# Patient Record
Sex: Female | Born: 1940 | Race: White | Hispanic: No | Smoking: Former smoker
Health system: Southern US, Community
[De-identification: ages and names within clinical notes are randomized; demographics above are authoritative.]

## PROBLEM LIST (undated history)

## (undated) DIAGNOSIS — I10 Essential (primary) hypertension: Secondary | ICD-10-CM

## (undated) HISTORY — DX: Essential (primary) hypertension: I10

---

## 2004-05-11 ENCOUNTER — Inpatient Hospital Stay (HOSPITAL_COMMUNITY): Admission: EM | Admit: 2004-05-11 | Discharge: 2004-05-14 | Payer: Self-pay | Admitting: Emergency Medicine

## 2008-05-26 ENCOUNTER — Emergency Department (HOSPITAL_COMMUNITY): Admission: EM | Admit: 2008-05-26 | Discharge: 2008-05-26 | Payer: Self-pay | Admitting: Emergency Medicine

## 2008-06-30 ENCOUNTER — Observation Stay (HOSPITAL_COMMUNITY): Admission: EM | Admit: 2008-06-30 | Discharge: 2008-07-01 | Payer: Self-pay | Admitting: Emergency Medicine

## 2009-08-24 IMAGING — CR DG CHEST 2V
2 series · 2 of 2 positions shown · non-contrast
Comparison: 05/11/2004

CLINICAL DATA: Left arm pain and numbness with indigestion.  Chest
pain.

CHEST - 2 VIEW

[w chest pa]
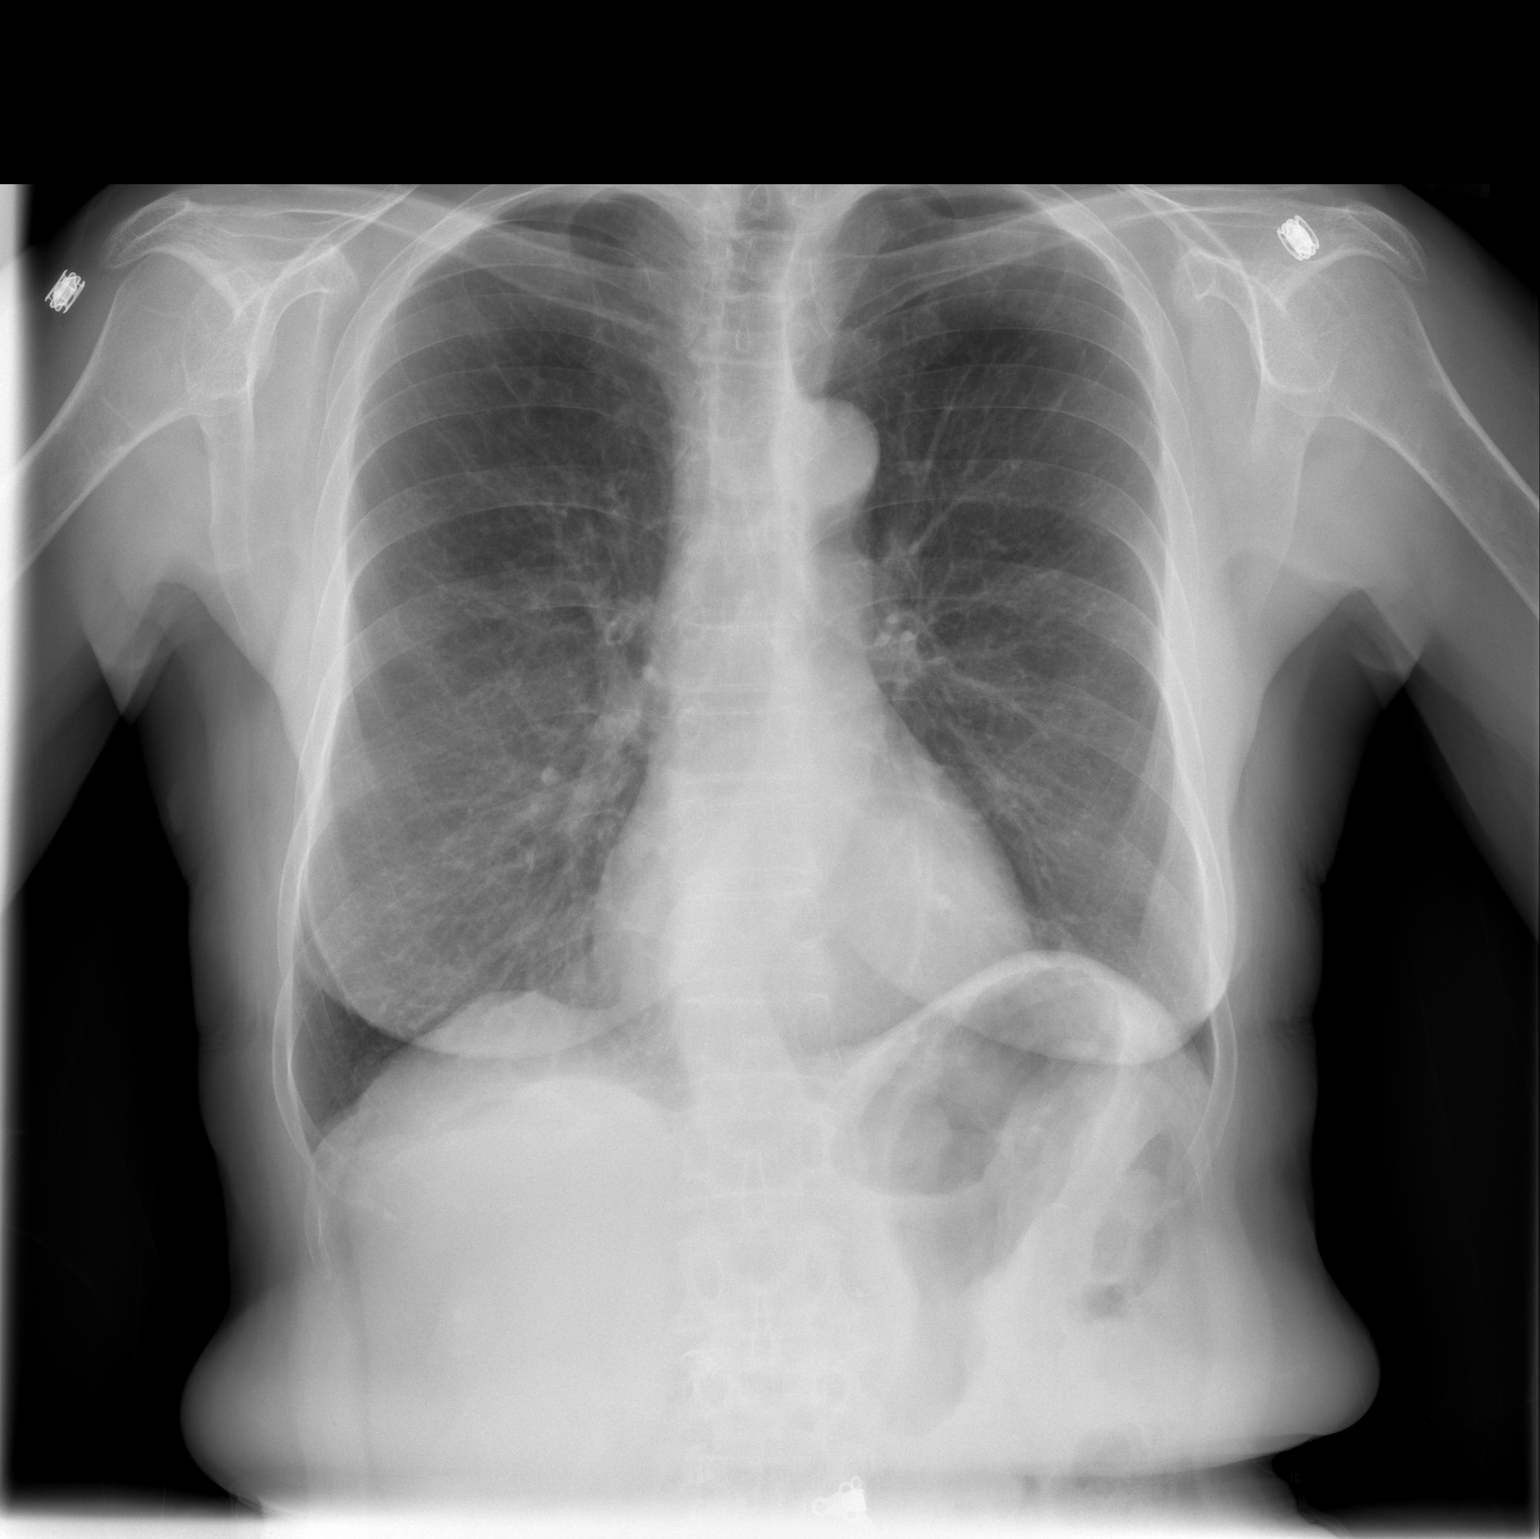

[w chest lat]
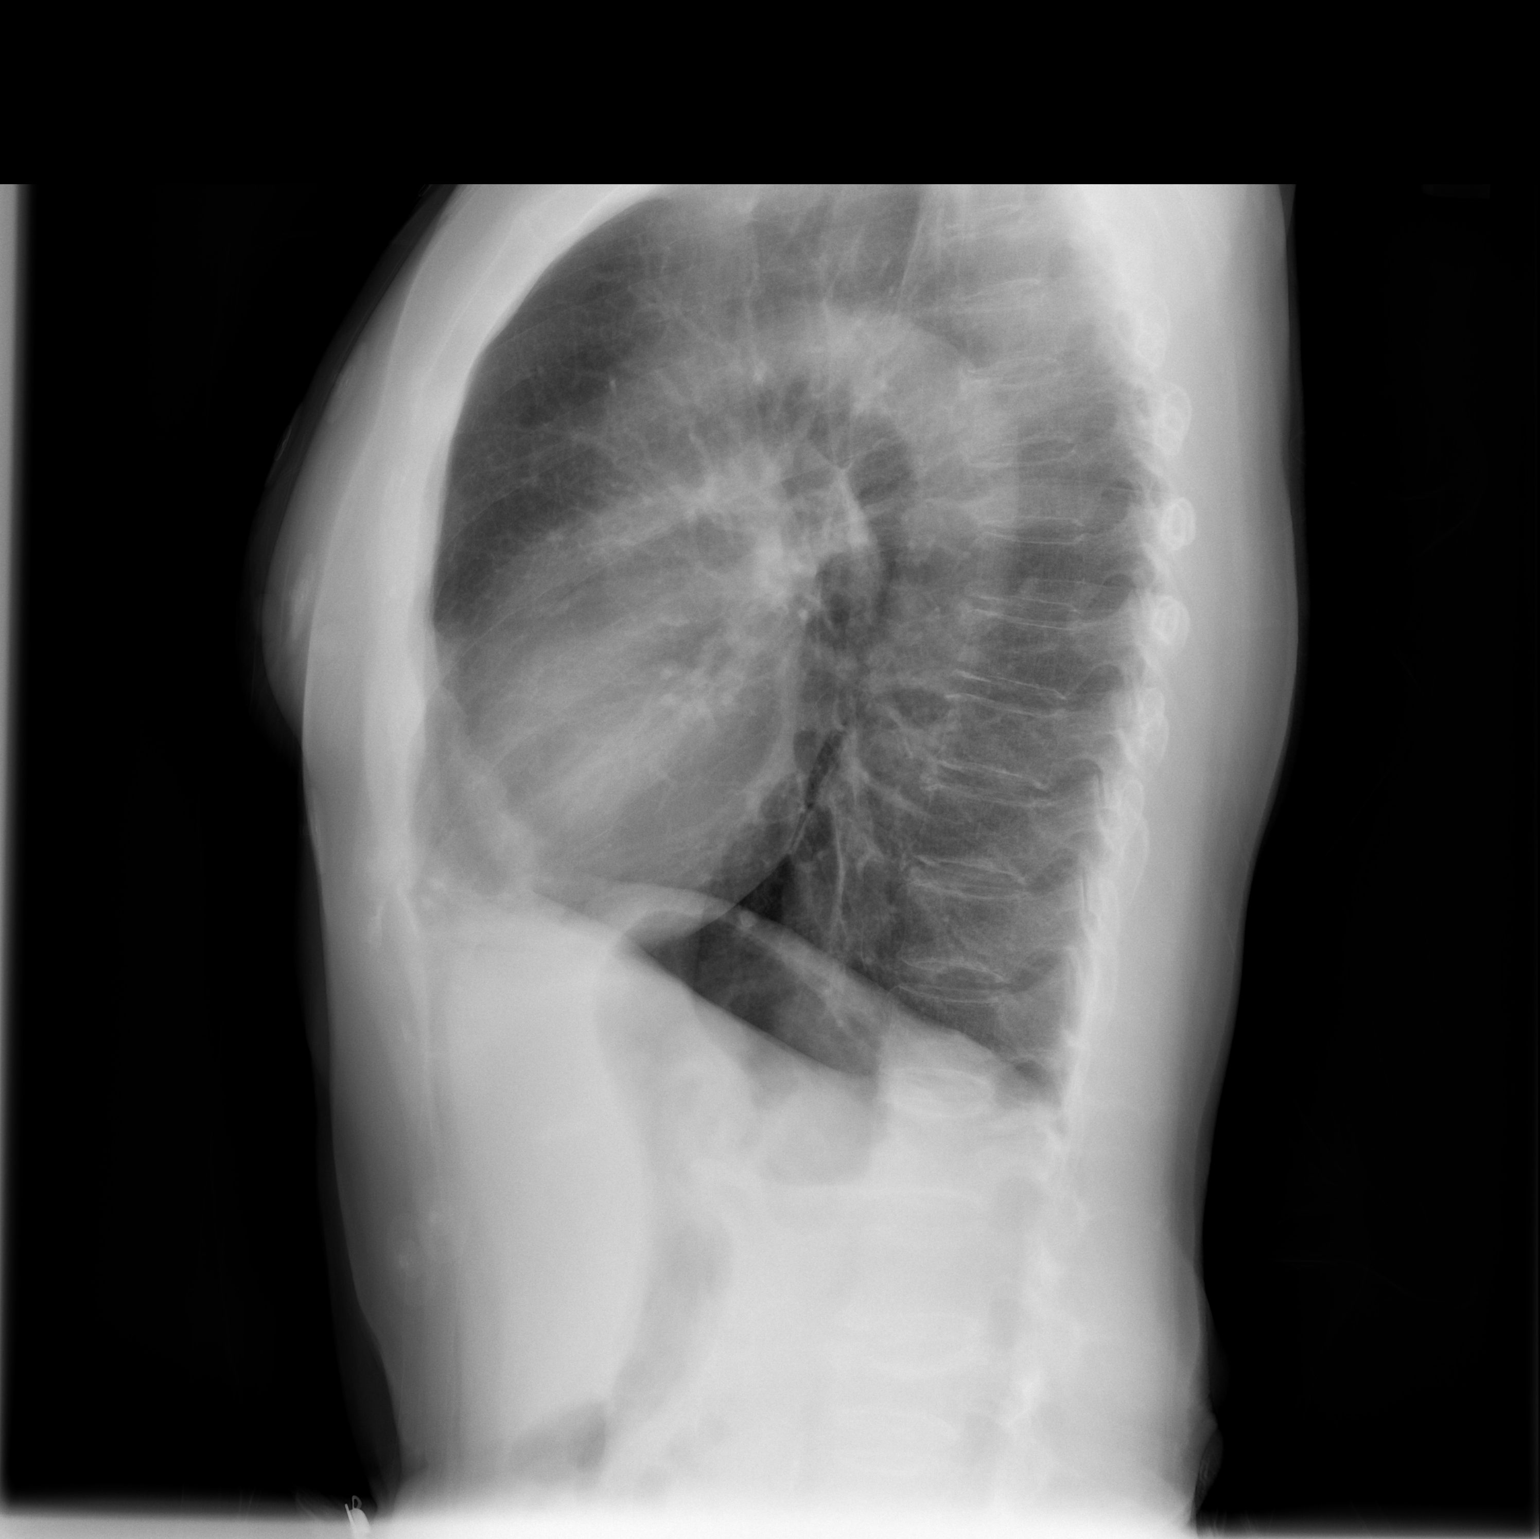

[2 of 2 positions shown; findings below may reference images not displayed]

FINDINGS: Trachea is midline.  Heart size normal.  Thoracic aorta
is calcified and tortuous.  Lungs are clear.  No pleural fluid.
IMPRESSION: No acute findings.

## 2009-09-28 IMAGING — CR DG CHEST 2V
2 series · 2 of 2 positions shown · non-contrast
Comparison: 05/26/2008

CLINICAL DATA: Chest pain

CHEST - 2 VIEW

[w chest pa]
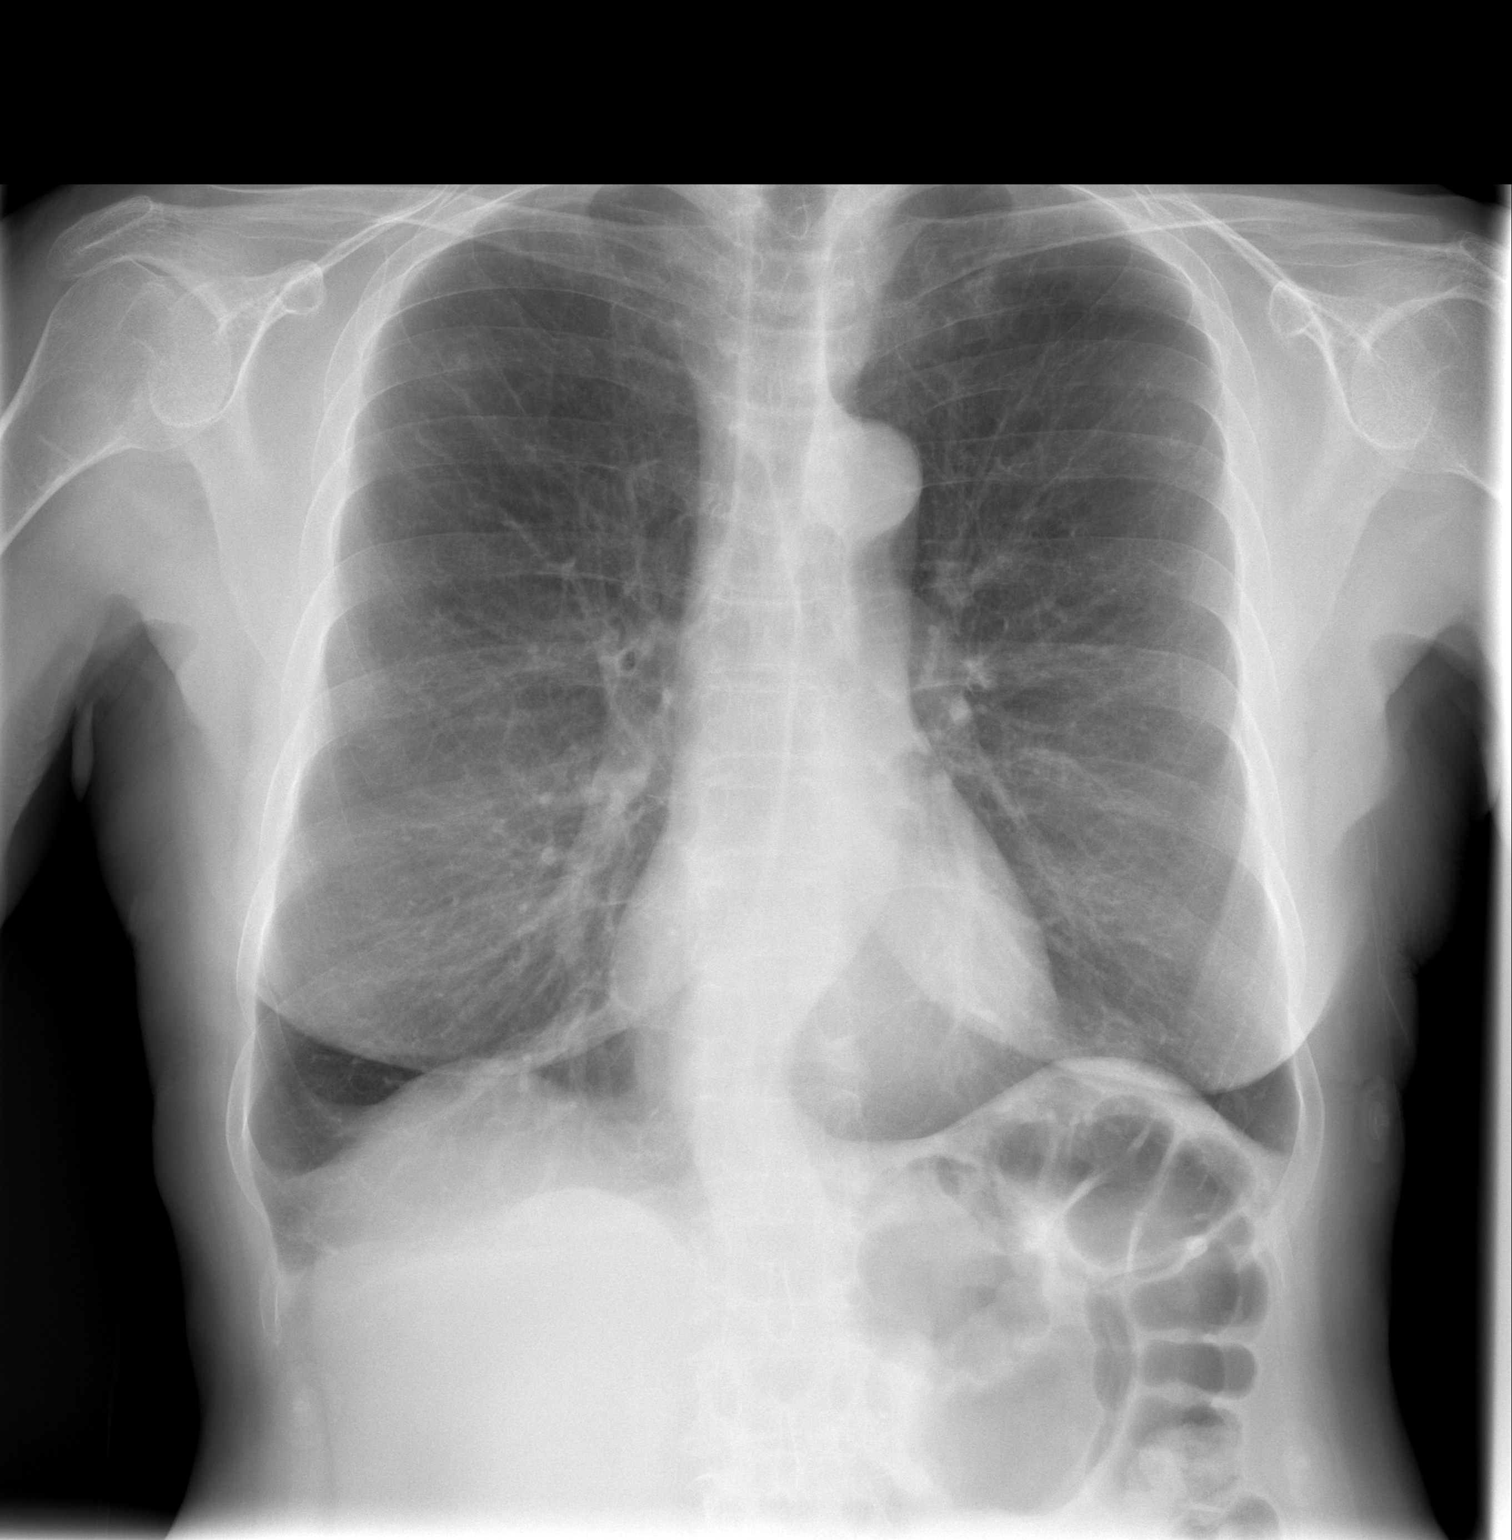

[w chest lat]
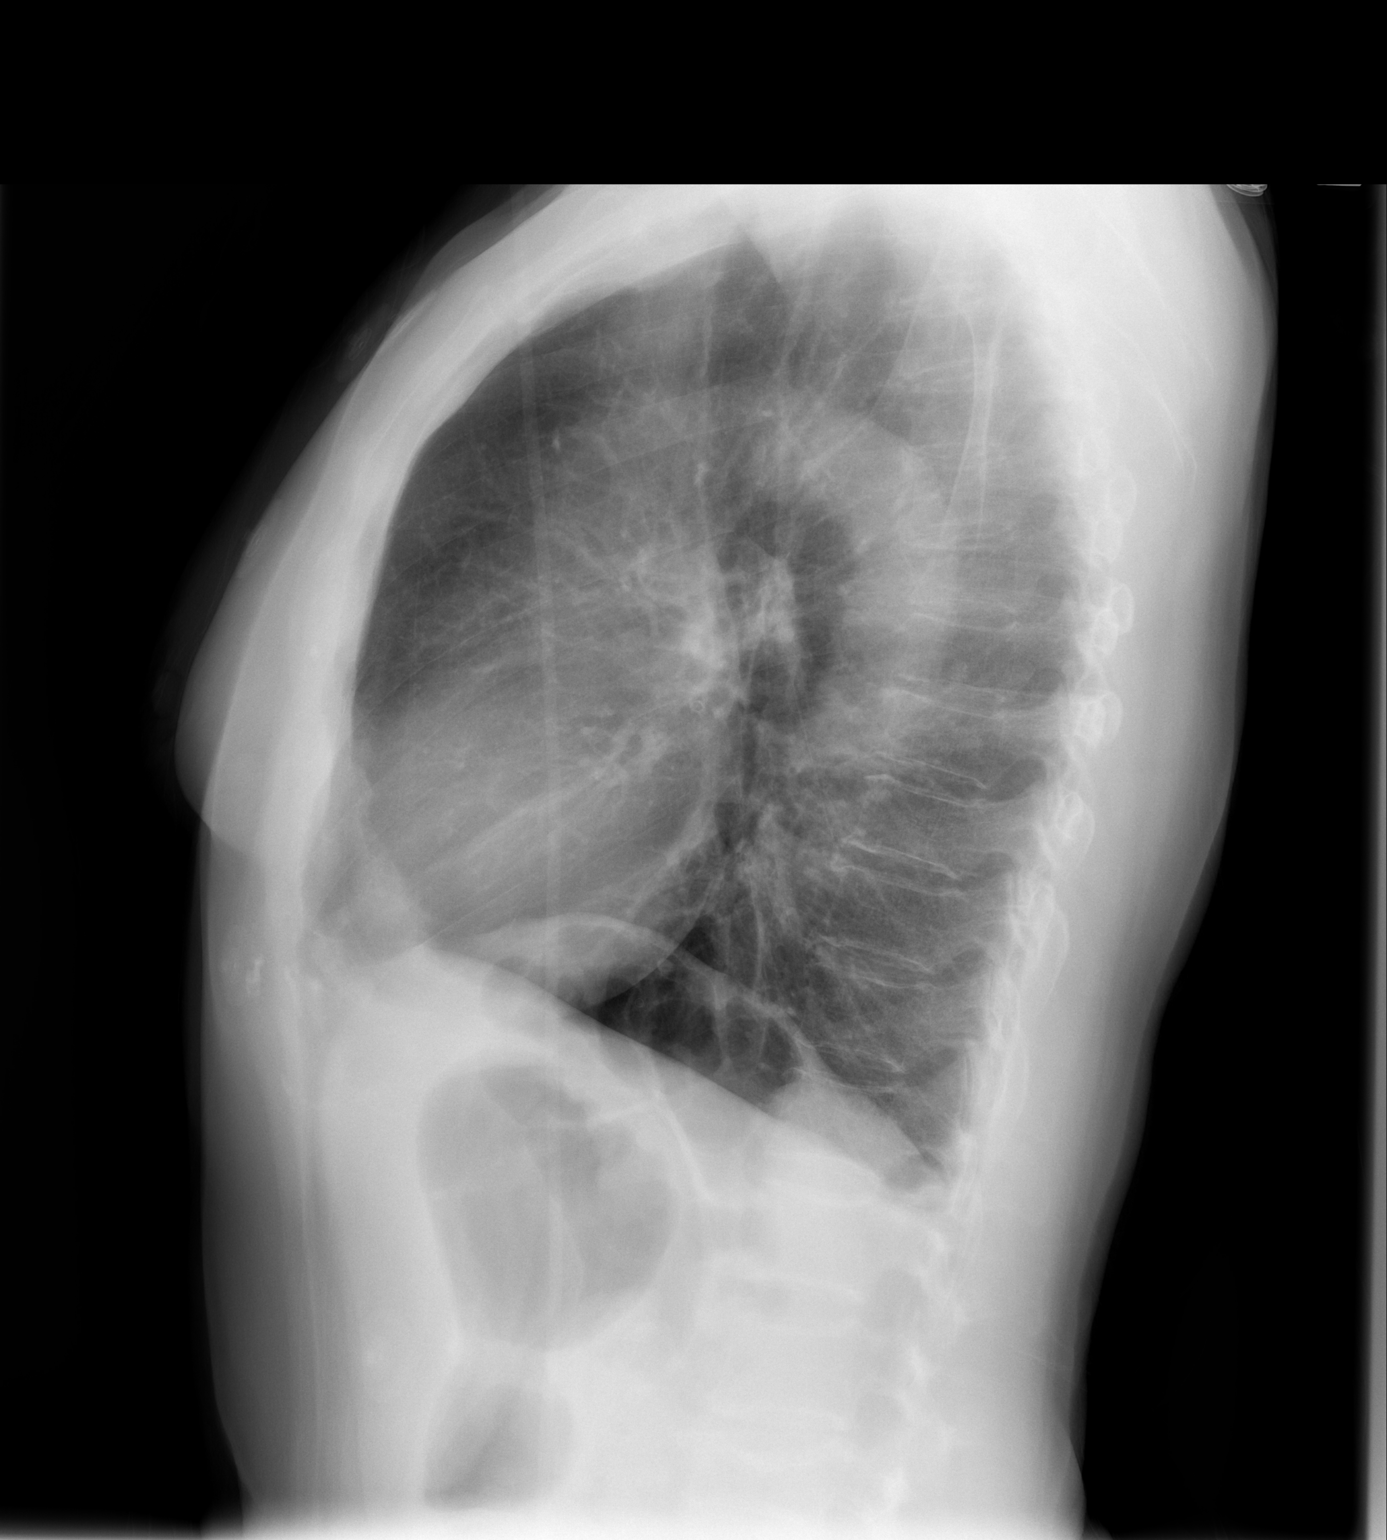

[2 of 2 positions shown; findings below may reference images not displayed]

FINDINGS: Heart and mediastinal contours normal.  Lungs
hyperaerated but clear.  No pleural fluid.  Osseous structures
intact.
IMPRESSION: COPD - no active disease.

## 2010-06-18 ENCOUNTER — Encounter: Payer: Self-pay | Admitting: Family Medicine

## 2010-09-12 LAB — LIPID PANEL
Cholesterol: 183 mg/dL (ref 0–200)
HDL: 62 mg/dL (ref >39)
LDL Cholesterol: 109 mg/dL — ABNORMAL HIGH (ref 0–99)
Total CHOL/HDL Ratio: 3 RATIO
Triglycerides: 58 mg/dL (ref <150)
VLDL: 12 mg/dL (ref 0–40)

## 2010-09-12 LAB — COMPREHENSIVE METABOLIC PANEL
ALT: 14 U/L (ref 0–35)
AST: 21 U/L (ref 0–37)
Albumin: 4 g/dL (ref 3.5–5.2)
Alkaline Phosphatase: 117 U/L (ref 39–117)
BUN: 6 mg/dL (ref 6–23)
CO2: 26 mEq/L (ref 19–32)
Calcium: 9.1 mg/dL (ref 8.4–10.5)
Chloride: 104 mEq/L (ref 96–112)
Creatinine, Ser: 0.73 mg/dL (ref 0.4–1.2)
GFR calc Af Amer: >60 mL/min (ref >60)
GFR calc non Af Amer: >60 mL/min (ref >60)
Glucose, Bld: 109 mg/dL — ABNORMAL HIGH (ref 70–99)
Potassium: 3.8 mEq/L (ref 3.5–5.1)
Sodium: 139 mEq/L (ref 135–145)
Total Bilirubin: 0.6 mg/dL (ref 0.3–1.2)
Total Protein: 7.1 g/dL (ref 6.0–8.3)

## 2010-09-12 LAB — CK TOTAL AND CKMB (NOT AT ARMC)
CK, MB: 1.3 ng/mL (ref 0.3–4.0)
Relative Index: INVALID (ref 0.0–2.5)
Total CK: 45 U/L (ref 7–177)

## 2010-09-12 LAB — DIFFERENTIAL
Basophils Absolute: 0.1 10*3/uL (ref 0.0–0.1)
Basophils Relative: 1 % (ref 0–1)
Eosinophils Absolute: 0 10*3/uL (ref 0.0–0.7)
Eosinophils Relative: 0 % (ref 0–5)
Lymphocytes Relative: 22 % (ref 12–46)
Lymphs Abs: 2.5 10*3/uL (ref 0.7–4.0)
Monocytes Absolute: 0.5 10*3/uL (ref 0.1–1.0)
Monocytes Relative: 4 % (ref 3–12)
Neutro Abs: 8.2 10*3/uL — ABNORMAL HIGH (ref 1.7–7.7)
Neutrophils Relative %: 73 % (ref 43–77)

## 2010-09-12 LAB — CARDIAC PANEL(CRET KIN+CKTOT+MB+TROPI)
CK, MB: 1.3 ng/mL (ref 0.3–4.0)
CK, MB: 1.6 ng/mL (ref 0.3–4.0)
Relative Index: INVALID (ref 0.0–2.5)
Relative Index: INVALID (ref 0.0–2.5)
Total CK: 47 U/L (ref 7–177)
Total CK: 58 U/L (ref 7–177)
Troponin I: <0.01 ng/mL (ref 0.00–0.06)
Troponin I: <0.01 ng/mL (ref 0.00–0.06)

## 2010-09-12 LAB — TROPONIN I: Troponin I: <0.01 ng/mL (ref 0.00–0.06)

## 2010-09-12 LAB — POCT CARDIAC MARKERS
CKMB, poc: <1 ng/mL — ABNORMAL LOW (ref 1.0–8.0)
Myoglobin, poc: 45.6 ng/mL (ref 12–200)
Troponin i, poc: <0.05 ng/mL (ref 0.00–0.09)

## 2010-09-12 LAB — CBC
HCT: 41.7 % (ref 36.0–46.0)
Hemoglobin: 14.6 g/dL (ref 12.0–15.0)
MCHC: 35.1 g/dL (ref 30.0–36.0)
MCV: 93.2 fL (ref 78.0–100.0)
Platelets: 267 10*3/uL (ref 150–400)
RBC: 4.48 MIL/uL (ref 3.87–5.11)
RDW: 13.8 % (ref 11.5–15.5)
WBC: 11.2 10*3/uL — ABNORMAL HIGH (ref 4.0–10.5)

## 2010-10-10 NOTE — Consult Note (Signed)
NAMEEVANIA, LYNE NO.:  [id number]   MEDICAL RECORD NO.:  [id number]          PATIENT TYPE:  EMS   LOCATION:  MAJO                         FACILITY:  [facility]   PHYSICIAN:  Lonia Blood, M.D.DATE OF BIRTH:  September 10, 1940   DATE OF CONSULTATION:  05/26/2008  DATE OF DISCHARGE:                                 CONSULTATION   PRIMARY CARE PHYSICIAN:  Production assistant, radio.   REASON FOR CONSULTATION:  Chest pain.   REFERRING PHYSICIAN:  Hilario Quarry, M.D.   HISTORY OF PRESENT ILLNESS:  Victoria Fitzgerald is a very pleasant 70-  year-old female.  She has been unable to afford her medications of late  because she recently lost her job.  She has been under a significant  stress with a son who was recently diagnosed with leukemia as well as  her father who is being placed in the skilled nursing facility due to  dementia.  Yesterday she had multiple episodes of watery diarrhea and  severe nausea.  There was a one or two episodes of vomiting.  This  resolved in the evening hours.  Today when the patient woke up, she had  a sense of having to burp a lot.  Additionally she had a sense of a  muscular type pull in her left shoulder.  There was specifically no  tingling in the left arm.  There was no radiation of pain or shooting  pain from the chest into the jaw or left arm.  The sensation in the  chest was that of indigestion.  The patient states it was not even pain.  She specifically states that it is not a pressure-sensation.  She states  that it is a feeling she needed to burp, that she then burps and  then  her symptoms completely resolve.  She is having no dyspnea on exertion  whatsoever.  She had no orthopnea.  She has had no exertional chest pain  of late.  In the emergency room, she continued to have symptoms which  continued to be resolved with burping.  She has had no further diarrhea.  She has had no nausea or vomiting.   REVIEW OF SYSTEMS:   Comprehensive review of systems is unremarkable with  the exception of the multiple positive elements noted in the history of  present illness above.   PAST MEDICAL HISTORY:  1. Hypertension.  2. Osteoporosis.  3. Status post left hip hemiarthroplasty after a fall on ice.  4. Tobacco abuse in the amount of 1-1/2 packs per day since teen      years.  5. Unknown cholesterol status.   MEDICATIONS:  None.   ALLERGIES:  No known drug allergies.   FAMILY HISTORY:  No significant family history or coronary disease in  either the mother or the father.   SOCIAL HISTORY:  The patient recently lost her job.  She lives in the  [place] area.  She does not drink alcohol. She has one son who was  recently diagnosed with leukemia.   LABORATORY DATA:  White count is elevated at 13,000.  Hemoglobin,  platelet count and MCV are normal.  BMET is wholly unremarkable.  Urinalysis is unrevealing.  Point-of-care cardiac markers are negative.  Chest x-ray reveals no acute disease.  A 12-lead EKG reveals normal  sinus rhythm and 95 beats per minute with no acute ST-T wave changes and  no Q waves.   PHYSICAL EXAMINATION:  VITAL SIGNS:  Temperature 97.9, blood pressure  191/107 initially and then 160;74 in followup.  Heart rate 101 and then  81 in followup.  Respiratory rate 15, oxygen saturation 99% on room air.  GENERAL:  Well-developed, well-nourished female in no acute respiratory  distress.  LUNGS:  Clear to auscultation bilaterally without wheezes or rhonchi.  CARDIOVASCULAR:  Regular rate and rhythm without murmur, gallop or rub  with normal S1 and S2.  ABDOMEN:  Nontender, nondistended, soft.  Bowel sounds present.  No  hepatosplenomegaly.  No rebound, no ascites.  EXTREMITIES:  No significant cyanosis, clubbing or edema bilateral lower  extremities.  NEUROLOGIC:  Nonfocal.   IMPRESSION:  1. Noncardiac chest pain.  The patient's chest pain is not consistent      with angina.  The  description of the pain, the character of the      pain, the recurrence of the pain as well as inciting elements are      not consistent with angina.  The symptoms are of acute onset and      closely follow other gastrointestinal symptoms as reported above.      The patient's EKG is unrevealing.  A single set of point-of-care      markers are negative at the present time.  The patient is a smoker      and has had hypertension.  She has unknown cholesterol.  She does      not have significant family history.  She is not diabetic.  Given      the fact that the patient's symptoms are not consistent with      angina, I do not feel that inpatient hospitalization is likely to      afford the patient any significant benefit.  I have counseled      extensively as to the need to return immediately to the ER should      she develop substernal chest pressure or actual tingling or      radiation of said sensation into her left arm.  She voices      understanding.  2. Uncontrolled hypertension.  The patient presented to the ER with      severely uncontrolled hypertension.  With nitroglycerin her blood      pressure decreased to recorded 160/74.  Systolic was actually 140      at the time of my evaluation per the monitor in the room.  The      patient admits she has not been able to take her Benicar because      she can no longer afford it having lost her insurance.  I have      discussed with her the importance of daily adherence with her      medication regimen.  I will initiate Lopressor 50 mg b.i.d.  This      should provide the added benefit of being available for $4.00 at      either [facility] or [facility].  I have provided the patient with a      prescription.  She is advised to follow up with her physician at  [facility] on January 1 or January 4 for      reevaluation of her blood pressure and for ongoing provision of      prescriptions.  3. Probable indigestion.  The patient  is likely suffering with a      gastritis related to her recent viral gastroenteritis.  I have      advised her to use over-the-counter Prilosec OTC and also Mylanta      or a Maalox-type solution.  She will try these as needed.   Thank you for your consultation on this very pleasant patient.  I do not  feel that there is significant to be accomplished by admitting the  patient to the hospital.  Her blood pressure is now well controlled and  her symptoms are not consistent with true unstable angina pectoris.  I  will discharge the patient home with the above list of followup  recommendations.     Lonia Blood, M.D.  Electronically Signed    JTM/MEDQ  D:  05/26/2008  T:  05/27/2008  Job:  [id number]   cc:   [facility]

## 2010-10-10 NOTE — H&P (Signed)
NAMESUN, KIHN NO.:  [id number]   MEDICAL RECORD NO.:  [id number]          PATIENT TYPE:  OBV   LOCATION:  4729                         FACILITY:  [facility]   PHYSICIAN:  Charlestine Massed, MDDATE OF BIRTH:  Apr 21, 1941   DATE OF ADMISSION:  06/30/2008  DATE OF DISCHARGE:                              HISTORY & PHYSICAL   PRIMARY CARE PHYSICIAN:  [facility] but patient has not  been there for the past 4 years.   REASON FOR ADMISSION:  Chest pain.   CHIEF COMPLAINT:  Chest pain.   HISTORY OF PRESENT ILLNESS:  Ms. Victoria Fitzgerald is a pleasant 70-year-  old female who lives in [place], who comes into the emergency room  for complaints of chest pain.  She states that she was seen here in  December.  Also, I can verify that with the consult done by the medical  doctor here for chest pain at that time.  She gets the chest pain off  and on, very often.  It was off for quite some time for a few weeks now,  and it came back again.  The pain is more in the chest retrosternal  area.  She feels more of a bloating rather than a pressure-like pain,  and sometimes it is sharp.  It sometimes stays for a few minutes,  sometimes for hours.  Recently, she has been noting that she gets some  tingling and numbness feeling in the left upper extremity, occasionally  even now she gets a chest pain.  This has not happened before.  In the  last 2 weeks, this is happening.  This does not happen every time she  gets a chest pain but occasionally she gets a numbness and tingling  feeling in the left upper extremity.  This numbness feeling in the left  upper extremity does not happen by itself, and it happens only during  episodes of chest pain.   Patient takes a lot of Coke, and she says that she has been having  extreme difficulty with belching and bloating issues.  She has prior  acid-reflux issues before.   She was prescribed Lopressor the last visit to the  emergency room by the  MD, but she states that she has not taken any medications.  Right now,  she is not taking any medicine.   She denies any other pain.  No shortness of breath.  No nausea,  vomiting, or diarrhea.  No chest pain.  No paroxysmal nocturnal dyspnea.  She states that she does not have any dyspnea on exertion, even outside.  She can walk any number of steps as well as she can walk even more than  a mile without getting shortness of breath.  She is not aware of her  cholesterol status and has not been tested before.  She has not seen her  primary doctor for many years now.   No palpitations.  No headache.  No loss of vision.  No blurring of  vision.  No loss of consciousness.  No fever, no chills, no cough, no  sputum production.  PAST MEDICAL HISTORY:  1. Hypertension.  2. Osteoporosis.  3. Status post left hip hemiarthroplasty after a fall.  4. History of tobacco abuse in her teen years but has not smoked at      all for more than 40 years now.   ALLERGIES:  No known drug allergies.   CURRENT MEDICATIONS:  None.  Even though she was prescribed Lopressor,  she has not taken any.   FAMILY HISTORY:  No significant family history of heart disease.  Her  son was recently diagnosed with leukemia.   SOCIAL HISTORY:  She lost her job, but she did not have insurance, but  right now she gets Medicare.  She lives in [place].  She denies  alcohol.  No smoking now.  She has some stress-related issues with her  son recently diagnosed with leukemia as well as her father who has been  admitted to a nursing home for dementia.   REVIEW OF SYSTEMS:  Review of her 12-point review of systems was done,  positive for those features as mentioned in the history of present  illness, and is negative otherwise.   PHYSICAL EXAMINATION:  VITAL SIGNS:  Blood pressure 140/84, heart rate  81, respirations 18, temperature 96.7, O2 sat 99% on room air.  GENERAL:  Patient is awake and  alert, well-oriented, not in any  distress.  No pain now.  HEAD/NECK:  Pupils reactive to light.  No bleeding seen in the oral or  nasal mucosa.  No postnasal discharge.  Neck is supple.  No JVD.  No  bruits.  CHEST:  Bilateral air entry is good anteriorly and posteriorly.  No  rales, no wheeze.  CARDIAC:  S1 and S2 heard.  Regular.  No murmur.  No palpable tenderness  on the chest.  ABDOMEN:  Soft, nontender.  No organomegaly.  No mass palpated.  No  pulsations seen.  Bowel sounds are positive.  No costovertebral angle  tenderness.  EXTREMITIES:  Bilaterally no pedal edema.  No tenderness, swelling, or  erythema in the calf.  CNS:  No obvious motor or sensory deficits.  Comprehension and speech  are intact.  PSYCH:  She is slightly apprehensive, but her speech is not pressurized,  clear speech.  No obvious signs of depression seen when we talk. even  though she is very much worried about her son who is diagnosed with  leukemia.  MUSCULOSKELETAL:  Full range of motion present in all joints.   LABS:  EKG done today shows normal sinus rhythm.  Axis is normal.  Normal P waves.  Upright in lead 2.  No acute ST-T wave changes of  ischemia present.  No chamber enlargement on EKG.   Chest x-ray:  No cardiopulmonary disease seen on x-ray.   WBC 11.2, hemoglobin 14.6, hematocrit 41.7, platelets 267, neutrophils  73%.  BMP:  Sodium 139, potassium 3.8, chloride 104, bicarb 26, BUN 6,  creatinine 0.73, glucose 109, total bilirubin 0.6, alk phos 117, AST 21,  ALT 14, total protein 7.1, albumin 4, calcium 9.1.  CK-MB less than 1.  Troponin less than 0.05 per first set.  Myoglobin 45.6.   ASSESSMENT/PLAN:  1. Atypical chest pain with recurrent issues with left arm radiation,      rule out ischemia.  Will do 3 sets of troponin and cardiac enzymes      and if 2 sets are negative, then patient should have exercise      stress test with nuclear imaging in the a.m.  Admit patient to      telemetry  and observation status.  If status is negative, patient      can go home.  Patient can be educated not to worry much about the      recurrent chest pain she is getting.  As patient is having      recurrent chest pain episodes like this, she ends up in the      emergency department frequently for this issue.  Once any evidence      of ischemia is ruled out by stress test, patient can be educated      well about not to worry much about her chest pain which is      happening now.  The stress test will definitely aid in making a      decision of that sort.  2. Hypertension:  Patient has been started on Lopressor 12.5 mg b.i.d.      now.  Once this stress test is done, can double the dose to 25      b.i.d. and any other medications can also be added now as an      outpatient.  3. Cholesterol:  Check a fasting lipid level and continue to treat      accordingly.  4. Acid reflux disease:  Start patient on Protonix.  Can be discharged      on Prilosec, as it is cheaper.  Patient currently has Medicare.  5. Deep venous thrombosis prophylaxis with Lovenox for now.   A total of 60 minutes was spent on this admission and evaluation.      Charlestine Massed, MD  Electronically Signed     UT/MEDQ  D:  06/30/2008  T:  07/01/2008  Job:  [id number]

## 2010-10-10 NOTE — Discharge Summary (Signed)
Victoria Fitzgerald, Victoria Fitzgerald              ACCOUNT NO.:  [id number]   MEDICAL RECORD NO.:  [id number]          PATIENT TYPE:  OBV   LOCATION:  4729                         FACILITY:  [facility]   PHYSICIAN:  Monte Fantasia, MD  DATE OF BIRTH:  1940-10-31   DATE OF ADMISSION:  06/30/2008  DATE OF DISCHARGE:  07/01/2008                               DISCHARGE SUMMARY   DISCHARGE DIAGNOSES:  1. Atypical chest pain.  2. Hypertension, uncontrolled.  3. Gastroesophageal reflux disease.   MEDICATIONS UPON DISCHARGE:  1. Aspirin 81 mg p.o. daily.  2. Protonix 40 mg p.o. q.12 h.  3. Metoprolol 25 mg p.o. b.i.d.   COURSE DURING THE HOSPITAL STAY:  A 70 year old Caucasian lady patient  came into the emergency room with complaints of chest pain.  The patient  stated that the chest pain lasted on and off, more of epigastric in  nature, and tingling sensations sometimes to the left upper extremity.  The patient was admitted to telemetry unit and cardiac enzymes were  cycled for x3, which were negative.  The patient also had a Cardiology  evaluation in view of stress test.  As per the Cardiology evaluation,  was advised to have a GI cocktail with a PPI and ambulate, if would be  symptomatic, then would consider the patient for a stress echo in a.m.  If the patient is asymptomatic, would be stable to be discharged and to  follow up with the [facility] Cardiology for stress test as an  outpatient.  The patient was given GI cocktail for the same.  She has  been asymptomatic on ambulation and as per Cardiology, the patient is  stable to be discharged.   LABORATORY DATA:  Laboratory investigations done during the stay in the  hospital; total WBC 11.2, hemoglobin 14.6, hematocrit 41.7, neutrophils  8.2.  Cardiac enzymes x3 sets are negative.  Sodium 139, potassium 3.8,  chloride 104, bicarb 26, glucose 109, BUN 6, creatinine 0.73.  Total  bilirubin 0.6, alkaline phosphatase 117, AST 21, ALT 14, total  protein  7.1, albumin 4.0, calcium 9.1.  Total cholesterol 183 , triglycerides  58, HDL 62, LDL 109.  Radiological investigation done during the stay in  the hospital.  Chest x-ray done on July 01, 2007; impression, COPD  with no active disease.   DISPOSITION:  The patient is planned to be discharged today and  recommended to follow up as an outpatient with [facility] for  stress test as an outpatient.  The patient has been given contact  information to schedule an appointment for the same.      Monte Fantasia, MD  Electronically Signed     MP/MEDQ  D:  07/01/2008  T:  07/02/2008  Job:  [id number]

## 2010-10-13 NOTE — Op Note (Signed)
Victoria Fitzgerald, [name]                ACCOUNT NO.:  [id number]   MEDICAL RECORD NO.:  [id number]          PATIENT TYPE:  INP   LOCATION:  5038                         FACILITY:  [facility]   PHYSICIAN:  Lubertha Basque. Dalldorf, M.D.DATE OF BIRTH:  1940-07-05   DATE OF PROCEDURE:  05/11/2004  DATE OF DISCHARGE:                                 OPERATIVE REPORT   PREOPERATIVE DIAGNOSES:  Left hip displaced femoral neck fracture.   POSTOPERATIVE DIAGNOSES:  Left hip displaced femoral neck fracture.   OPERATION PERFORMED:  Left hip cemented hemiarthroplasty.   SURGEON:  Lubertha Basque. Jerl Santos, M.D.   ASSISTANT:  Prince Rome, P.A.   ANESTHESIA:  General.   INDICATIONS FOR PROCEDURE:  The patient is a 70 year old woman who fell  today at work and sustained a displaced femoral neck fracture.  She was  unable to get up and was taken to the emergency room.  X-rays confirmed her  displaced fracture and she was offered hemiarthroplasty as treatment.  Informed operative consent was obtained after discussion of possible  complications of reaction to anesthesia, infection, DVT, dislocation, and  death.   DESCRIPTION OF PROCEDURE:  The patient was taken to the operating suite  where general anesthetic was applied without difficulty.  The patient was  positioned in the lateral decubitus position with the left hip up.  Hip  positioners were used, axillary roll placed, and all bony prominences were  appropriately padded.  She was prepped and draped in the normal sterile  fashion.  After administration of preop intravenous antibiotics, a posterior  approach was taken to the left hip.  All appropriate anti-infective measures  were used including a preoperative antibiotic and a Betadine impregnated  drape.  Dissection was carried down through a moderate amount of adipose  tissue to the short external rotators which were tagged and reflected.  The  bed of the posterior capsule was excised. A revision femoral  neck cut was  made just distal to the lesser trochanter.  The femoral head was removed and  sized to a 47.  A trial femoral head was placed in the acetabulum which  seemed to fit well.  There were no degenerative changes in the acetabulum.  Attention was turned toward the femur.  This was broached and reamed  appropriately to a size 3 component.  A trial reduction was done with  several different head and neck assemblies and the best seemed to be a 47+0.  The trial component was removed followed by placement of a size 5 cement  restricter.  Pulsatile lavage was used to cleanse the inside of the femoral  tunnel and this was dried completely.  Cement was then pressurized into this  followed by placement of a Depuy Excel size 3 stem.  This was placed in  appropriate anteversion.  Excess cement was trimmed.  The pressure was held  on the component until the cement had hardened completely.  This was then  topped with a 47+0 unipolar assembly.  The hip was then reduced and was  stable in extension with external rotation and flexion with internal  rotation.  The leg lengths seemed to be equal.  The wound was thoroughly  irrigated following the reapproximation of the short external rotators to  the greater trochanteric region with nonabsorbable suture.  Iliotibial band  and gluteus maximus fascia reapproximated with #1 Vicryl and interrupted  fashion followed by subcutaneous reapproximation in two layers using 0 and 2-  0 undyed Vicryl.  Skin was closed with staples.  Adaptic was placed on the  wound followed by dry gauze and tape.  Estimated blood loss and  intraoperative fluids can be obtained from anesthesia records.  No drain was  placed.   DISPOSITION:  The patient was extubated in the operating room and taken to  the recovery room in stable condition.  Plans were for the patient to be  admitted back to the orthopedic surgery service for appropriate  postoperative care to include  perioperative antibiotic with Coumadin plus  Lovenox for deep venous thrombosis prophylaxis.      Cindee Lame   PGD/MEDQ  D:  05/11/2004  T:  05/12/2004  Job:  [id number]

## 2010-10-13 NOTE — Discharge Summary (Signed)
Victoria Fitzgerald, Victoria Fitzgerald                ACCOUNT NO.:  [id number]   MEDICAL RECORD NO.:  [id number]          PATIENT TYPE:  INP   LOCATION:  5038                         FACILITY:  [facility]   PHYSICIAN:  Lubertha Basque. Dalldorf, M.D.DATE OF BIRTH:  Apr 16, 1941   DATE OF ADMISSION:  05/11/2004  DATE OF DISCHARGE:  05/14/2004                                 DISCHARGE SUMMARY   ADMITTING DIAGNOSES:  1.  Left hip subcapital fracture.  2.  Hypertension.  3.  Osteoporosis.  4.  History of depression.   DISCHARGE DIAGNOSES:  1.  Left hip subcapital fracture.  2.  Hypertension.  3.  Osteoporosis.  4.  History of depression.   OPERATION:  Left hip hemiarthroplasty.   BRIEF HISTORY:  Ms. 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  She had significant  pain in her left hip.  She was unable to stand and walk and was transported  by EMS to the emergency room at [facility].  Upon presentation, she had  significant hip pain.  X-rays were taken of her left hip which showed a  subcapital displaced hip fracture.  We discussed treatment options with the  patient, that being a hemiarthroplasty, and then the recovery period was  also discussed, and the risk of anesthesia, infection, DVT, and possible  death.   PERTINENT LABORATORY AND X-RAY FINDINGS:  Pelvis:  Subcapital fracture left  femoral neck as discussed.  WBCs 11.8, RBCs 2.94, hemoglobin 9.3, hematocrit  26.9.  Protime 13.8, with an INR of 1.1, and on last testing 2.1.  Sodium  131, potassium 2.9, glucose 123, creatinine 0.8, BUN 6, calcium 7.7.   COURSE IN THE HOSPITAL:  The patient was admitted from the emergency room  with the diagnosis of a left hip fracture - ORIF, hemiarthroplasty, IV of  lactated Ringer's.  She was on a low-dose morphine PCA pump, three doses of  Ancef 1 g IV q.8 h. x 3 doses given, laxative and Colace p.r.n., Phenergan  as needed for nausea, Percocet by  mouth for pain.  Kept on her home  medicines, Benicar 12.5 one p.o. daily; Zoloft 50 mg one daily.  She was  also on Lovenox and Coumadin prophylaxis for DVT low dose, ice to her left  hip, Foley catheter initially and then discontinued, knee immobilizer to her  left leg when in bed, physical therapy to be weightbearing as tolerated,  follow-up laboratory work as described above, physical therapy and OT  consults, and total hip precautions.  She had incentive spirometry and knee-  high TEDs.  The first day postop, her blood pressure was 93/58.  Temperature  was 100.  Potassium 3.2.  INR 1.1.  Hemoglobin 9.8.  Lungs were clear to  A&P.  Cardiac:  S1 and S2.  Abdomen was soft.  Hip wound was normal.  There  was no sign of irritation or infection on her skin.  She could be  weightbearing as tolerated.  We had noted at that point we would keep her  on  Coumadin low-dose DVT prophylaxis for 30 days.  Physical therapy was  consulted for gait training and weightbearing as tolerated.  The second day  postop, her temperature was 100.9.  Temperature 98.8; her other vital signs  were stable.  Hemoglobin 9.3.  INR 1.2.  Dressing was changed, and wound was  benign.  No sign of infection or irritation.  Her PCA was discontinued at  that point.  The third day postop, she had passed most of her therapy goals.  Temperature was about 99.  INR 2.1.  Dressing remained dry, with minimal to  no serous drainage.  Lungs clear.  Abdomen soft.  She would be discharged  home, to follow up in the office.   CONDITION ON DISCHARGE:  Improved.   FOLLOWUP:  She will remain on her home medications:  1.  Benicar 12.5 one daily.  2.  Zoloft 50 mg one daily.  3.  She was given a prescription for Coumadin, to be taken at dose per      pharmacy, to start off at 2.5 mg, and then will be changed as her INRs      are drawn.  4.  Tylox 1 or 2 q.4-6 h. for pain.   She could be weightbearing as tolerated, with walker,  crutches.   DIET:  Unrestricted.   She may change her dressing daily if necessary.   Would call our office at [phone] for any sign of infection, and also to  make an appointment in 7-10 days.  She also was arranged to have home care  by [facility] agency for home protimes and physical therapy.   As of record,  I did not fill out the pink sheet on her discharge.      MC/MEDQ  D:  07/18/2004  T:  07/18/2004  Job:  [id number]

## 2011-03-02 LAB — DIFFERENTIAL
Basophils Absolute: 0.1 10*3/uL (ref 0.0–0.1)
Basophils Relative: 1 % (ref 0–1)
Eosinophils Absolute: 0 10*3/uL (ref 0.0–0.7)
Eosinophils Relative: 0 % (ref 0–5)
Lymphocytes Relative: 15 % (ref 12–46)
Lymphs Abs: 2 10*3/uL (ref 0.7–4.0)
Monocytes Absolute: 0.8 10*3/uL (ref 0.1–1.0)
Monocytes Relative: 6 % (ref 3–12)
Neutro Abs: 10.1 10*3/uL — ABNORMAL HIGH (ref 1.7–7.7)
Neutrophils Relative %: 78 % — ABNORMAL HIGH (ref 43–77)

## 2011-03-02 LAB — URINALYSIS, ROUTINE W REFLEX MICROSCOPIC
Bilirubin Urine: NEGATIVE
Glucose, UA: NEGATIVE mg/dL
Ketones, ur: NEGATIVE mg/dL
Leukocytes, UA: NEGATIVE
Nitrite: NEGATIVE
Protein, ur: NEGATIVE mg/dL
Specific Gravity, Urine: 1.004 — ABNORMAL LOW (ref 1.005–1.030)
Urobilinogen, UA: 0.2 mg/dL (ref 0.0–1.0)
pH: 7 (ref 5.0–8.0)

## 2011-03-02 LAB — URINE MICROSCOPIC-ADD ON

## 2011-03-02 LAB — POCT I-STAT, CHEM 8
BUN: 5 mg/dL — ABNORMAL LOW (ref 6–23)
Calcium, Ion: 1.15 mmol/L (ref 1.12–1.32)
Chloride: 105 mEq/L (ref 96–112)
Creatinine, Ser: 0.8 mg/dL (ref 0.4–1.2)
Glucose, Bld: 96 mg/dL (ref 70–99)
HCT: 47 % — ABNORMAL HIGH (ref 36.0–46.0)
Hemoglobin: 16 g/dL — ABNORMAL HIGH (ref 12.0–15.0)
Potassium: 4.3 mEq/L (ref 3.5–5.1)
Sodium: 142 mEq/L (ref 135–145)
TCO2: 29 mmol/L (ref 0–100)

## 2011-03-02 LAB — POCT CARDIAC MARKERS
CKMB, poc: <1 ng/mL — ABNORMAL LOW (ref 1.0–8.0)
Myoglobin, poc: 84.3 ng/mL (ref 12–200)
Troponin i, poc: <0.05 ng/mL (ref 0.00–0.09)

## 2011-03-02 LAB — CBC
HCT: 43.3 % (ref 36.0–46.0)
Hemoglobin: 14.2 g/dL (ref 12.0–15.0)
MCHC: 32.9 g/dL (ref 30.0–36.0)
MCV: 95.3 fL (ref 78.0–100.0)
Platelets: 270 10*3/uL (ref 150–400)
RBC: 4.55 MIL/uL (ref 3.87–5.11)
RDW: 13.9 % (ref 11.5–15.5)
WBC: 13 10*3/uL — ABNORMAL HIGH (ref 4.0–10.5)

## 2020-05-28 DIAGNOSIS — U071 COVID-19: Secondary | ICD-10-CM

## 2020-05-28 HISTORY — DX: COVID-19: U07.1

## 2022-07-18 ENCOUNTER — Ambulatory Visit (INDEPENDENT_AMBULATORY_CARE_PROVIDER_SITE_OTHER): Payer: Medicare Other | Admitting: Pulmonary Disease

## 2022-07-18 ENCOUNTER — Encounter: Payer: Self-pay | Admitting: Pulmonary Disease

## 2022-07-18 VITALS — BP 130/72 | HR 71 | Ht 63.0 in | Wt 101.0 lb

## 2022-07-18 DIAGNOSIS — J9611 Chronic respiratory failure with hypoxia: Secondary | ICD-10-CM

## 2022-07-18 DIAGNOSIS — J449 Chronic obstructive pulmonary disease, unspecified: Secondary | ICD-10-CM

## 2022-07-18 MED ORDER — SPIRIVA RESPIMAT 2.5 MCG/ACT IN AERS: 2.0000 | INHALATION_SPRAY | Freq: Every day | RESPIRATORY_TRACT | 0 refills | Status: DC

## 2022-07-18 NOTE — Addendum Note (Signed)
Addended by: Valerie Salts on: 07/18/2022 11:27 AM   Modules accepted: Orders

## 2022-07-18 NOTE — Progress Notes (Signed)
Synopsis: Referred in February 2024 for COPD   Subjective:   PATIENT ID: Victoria Fitzgerald GENDER: female DOB: 1940-11-14, MRN: [id number]  HPI  Chief Complaint  Patient presents with   Consult    Referred by PCP for COPD. Had COVID twice (2021 and late 2022). Started out on 2L of O2 at night in 2021. Now on 2L 24/7.    Victoria Fitzgerald is a 82 year old woman, former smoker with COPD and chronic respiratory failure who is referred to pulmonary clinic.   She quit smoking at the end of 2021 after having covid in which she was hospitalized for 9 days. She was discharged on oxygen at that time and then was only using it at night until she was treated for covid again December 2023 and is now using oxygen throughout the day/night. She has exertional dyspnea and decreased appetite with weight loss. She denies cough or wheezing. She has albuterol rescue inhaler which she rarely uses.   She has 45 pack year history. She is widowed. She is accompanied by her son today.  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 She would like to return to work.  Past Medical History:  Diagnosis Date   Hypertension    Pneumonia due to COVID-19 virus 2022     Family History  Problem Relation Age of Onset   Leukemia Son      Social History   Socioeconomic History   Marital status: Single    Spouse name: Not on file   Number of children: Not on file   Years of education: Not on file   Highest education level: Not on file  Occupational History   Not on file  Tobacco Use   Smoking status: Former    Types: Cigarettes    Quit date: 05/29/2019    Years since quitting: 3.1    Passive exposure: Past   Smokeless tobacco: Never  Substance and Sexual Activity   Alcohol use: Not on file   Drug use: Not on file   Sexual activity: Not on file  Other Topics Concern   Not on file  Social History Narrative   Not on file   Social Determinants of Health   Financial Resource Strain: Not on file  Food  Insecurity: Not on file  Transportation Needs: Not on file  Physical Activity: Not on file  Stress: Not on file  Social Connections: Not on file  Intimate Partner Violence: Not on file     No Known Allergies   Outpatient Medications Prior to Visit  Medication Sig Dispense Refill   amLODipine (NORVASC) 10 MG tablet Take 1 tablet by mouth daily.     aspirin 81 MG chewable tablet Chew 81 mg by mouth daily.     Cholecalciferol 125 MCG (5000 UT) capsule Take 1 capsule by mouth daily.     famotidine (PEPCID) 20 MG tablet Take 20 mg by mouth 2 (two) times daily.     metoprolol tartrate (LOPRESSOR) 25 MG tablet Take 25 mg by mouth daily.     pravastatin (PRAVACHOL) 10 MG tablet Take 10 mg by mouth daily.     valsartan (DIOVAN) 80 MG tablet Take 1 tablet by mouth daily.     VENTOLIN HFA 108 (90 Base) MCG/ACT inhaler Inhale 1-2 puffs into the lungs every 6 (six) hours as needed.     No facility-administered medications prior to visit.   Review of Systems  Constitutional:  Negative for chills, fever, malaise/fatigue and  weight loss.  HENT:  Negative for congestion, sinus pain and sore throat.   Eyes: Negative.   Respiratory:  Positive for shortness of breath. Negative for cough, hemoptysis, sputum production and wheezing.   Cardiovascular:  Negative for chest pain, palpitations, orthopnea, claudication and leg swelling.  Gastrointestinal:  Positive for heartburn. Negative for abdominal pain, nausea and vomiting.  Genitourinary: Negative.   Musculoskeletal:  Negative for joint pain and myalgias.  Skin:  Negative for rash.  Neurological:  Negative for weakness.  Endo/Heme/Allergies: Negative.   Psychiatric/Behavioral: Negative.     Objective:   Vitals:   07/18/22 0941  BP: 130/72  Pulse: 71  SpO2: 96%  Weight: 101 lb (45.8 kg)  Height: 5' 3"$  (1.6 m)     Physical Exam Constitutional:      General: She is not in acute distress.    Appearance: She is not ill-appearing.  HENT:      Head: Normocephalic and atraumatic.  Eyes:     General: No scleral icterus.    Conjunctiva/sclera: Conjunctivae normal.     Pupils: Pupils are equal, round, and reactive to light.  Cardiovascular:     Rate and Rhythm: Normal rate and regular rhythm.     Pulses: Normal pulses.     Heart sounds: Normal heart sounds. No murmur heard. Pulmonary:     Effort: Pulmonary effort is normal.     Breath sounds: Rales (left base) present. No wheezing or rhonchi.  Abdominal:     General: Bowel sounds are normal.     Palpations: Abdomen is soft.  Musculoskeletal:     Right lower leg: No edema.     Left lower leg: No edema.  Lymphadenopathy:     Cervical: No cervical adenopathy.  Skin:    General: Skin is warm and dry.  Neurological:     General: No focal deficit present.     Mental Status: She is alert.  Psychiatric:        Mood and Affect: Mood normal.        Behavior: Behavior normal.        Thought Content: Thought content normal.        Judgment: Judgment normal.     CBC    Component Value Date/Time   WBC 11.2 (H) 06/30/2008 1231   RBC 4.48 06/30/2008 1231   HGB 14.6 06/30/2008 1231   HCT 41.7 06/30/2008 1231   PLT 267 06/30/2008 1231   MCV 93.2 06/30/2008 1231   MCHC 35.1 06/30/2008 1231   RDW 13.8 06/30/2008 1231   LYMPHSABS 2.5 06/30/2008 1231   MONOABS 0.5 06/30/2008 1231   EOSABS 0.0 06/30/2008 1231   BASOSABS 0.1 06/30/2008 1231      Latest Ref Rng & Units 06/30/2008   12:31 PM 05/26/2008    5:12 PM  BMP  Glucose 70 - 99 mg/dL 109  96   BUN 6 - 23 mg/dL 6  5   Creatinine 0.4 - 1.2 mg/dL 0.73  0.8   Sodium 135 - 145 mEq/L 139  142   Potassium 3.5 - 5.1 mEq/L 3.8  4.3   Chloride 96 - 112 mEq/L 104  105   CO2 19 - 32 mEq/L 26    Calcium 8.4 - 10.5 mg/dL 9.1     Chest imaging: CXR 07/04/22 Chronic interstitial changes. No focal pulmonary infiltrate, pleural effusion or pneumothorax.  Cardiomegaly. The mediastinal contour is otherwise unremarkable.   PFT:      No data to display  Labs:  Path:  Echo:  Heart Catheterization:  Assessment & Plan:   Chronic obstructive pulmonary disease, unspecified COPD type ([place]) - Plan: Pulmonary Function Test  Chronic respiratory failure with hypoxia ([place])  Discussion: Destene Barten is a 82 year old woman, former smoker with COPD and chronic respiratory failure who is referred to pulmonary clinic.   On simple walk today, she completed 1 lap and stopped due to dyspnea. SpO2 was 90% during ambulation from 94% on room air.   She is to continue to use oxygen 2L at this time as she recovers from her recent covid infection. She is to try spiriva 2.5 mcg 2 puffs daily and let us know if she would like prescription.   We will refer her to pulmonary rehab.   Follow up in 3 months with pulmonary function tests.  Freda Jackson, MD [facility] Office: [phone]   Current Outpatient Medications:    amLODipine (NORVASC) 10 MG tablet, Take 1 tablet by mouth daily., Disp: , Rfl:    aspirin 81 MG chewable tablet, Chew 81 mg by mouth daily., Disp: , Rfl:    Cholecalciferol 125 MCG (5000 UT) capsule, Take 1 capsule by mouth daily., Disp: , Rfl:    famotidine (PEPCID) 20 MG tablet, Take 20 mg by mouth 2 (two) times daily., Disp: , Rfl:    metoprolol tartrate (LOPRESSOR) 25 MG tablet, Take 25 mg by mouth daily., Disp: , Rfl:    pravastatin (PRAVACHOL) 10 MG tablet, Take 10 mg by mouth daily., Disp: , Rfl:    valsartan (DIOVAN) 80 MG tablet, Take 1 tablet by mouth daily., Disp: , Rfl:    VENTOLIN HFA 108 (90 Base) MCG/ACT inhaler, Inhale 1-2 puffs into the lungs every 6 (six) hours as needed., Disp: , Rfl:

## 2022-07-18 NOTE — Addendum Note (Signed)
Addended by: Valerie Salts on: 07/18/2022 11:28 AM   Modules accepted: Orders

## 2022-07-18 NOTE — Patient Instructions (Addendum)
Try spiriva 2.53mg 2 puffs daily and let [place] if you have improvement in your shortness of breath.   We will check your oxygen levels on room air today to determine if you do need oxygen at rest and with walking. We will work on qualifying you for portable oxygen concentrator if you do need it.   Continue to use oxygen at night when sleeping.   We will refer you to pulmonary rehab  Follow up in 3 months with pulmonary function tests

## 2022-07-20 ENCOUNTER — Telehealth (HOSPITAL_COMMUNITY): Payer: Self-pay

## 2022-07-20 NOTE — Telephone Encounter (Signed)
Received referral from Dr. Erin Fulling for this pt to participate in Pulmonary Rehab with the diagnosis of dyspnea. Clinical review of pt follow up appt on 07/18/22 Pulmonary office note. Pt appropriate for scheduling for Pulmonary rehab. Will forward to support staff for scheduling and verification of insurance eligibility/benefits with pt consent.   Janine Ores, RN, BSN Cardiac and Pulmonary Rehab

## 2022-07-30 ENCOUNTER — Telehealth: Payer: Self-pay | Admitting: Pulmonary Disease

## 2022-07-30 MED ORDER — SPIRIVA RESPIMAT 2.5 MCG/ACT IN AERS: 2.0000 | INHALATION_SPRAY | Freq: Every day | RESPIRATORY_TRACT | 6 refills | Status: DC

## 2022-07-30 NOTE — Telephone Encounter (Signed)
Yes, that is ok.  Thanks, JD

## 2022-07-30 NOTE — Telephone Encounter (Signed)
Pt. Calling back her son went to pharmacy and med was over $400 and she can't afford that needs a alternative med

## 2022-07-30 NOTE — Telephone Encounter (Signed)
Called and spoke with patient and went over pharmacy with her and medication that was being sent in. Nothing further needed

## 2022-07-30 NOTE — Telephone Encounter (Signed)
Patient advises Spiriva has been working well for her and would like a prescription sent to her pharmacy. Dr. Erin Fulling please advise if this is okay and we can get this sent in

## 2022-07-30 NOTE — Telephone Encounter (Signed)
Pt. Would like to get inhaler called into pharmacy was given samples at visit of [place]

## 2022-07-31 NOTE — Telephone Encounter (Signed)
Pharmacy team,  Which LAMA inhaler is best covered by patient's insurance?  Thanks, JD

## 2022-07-31 NOTE — Telephone Encounter (Signed)
Called and spoke with patient. Patient stated that her inhaler is too much and she wants to know if there is an alternative she could try.   JD, please advise.

## 2022-08-01 ENCOUNTER — Other Ambulatory Visit (HOSPITAL_COMMUNITY): Payer: Self-pay

## 2022-08-01 NOTE — Telephone Encounter (Signed)
Pt awaiting on updates for something less expensive instead of Spiriva

## 2022-08-01 NOTE — Telephone Encounter (Signed)
Per benefits investigation Brand Spiriva Handihaler is covered as well as the Spiriva Respimat. Patient may have a deductible to meet that is contributing to the cost.

## 2022-08-01 NOTE — Telephone Encounter (Signed)
[phone] is cell and preferred call back #.

## 2022-08-01 NOTE — Telephone Encounter (Signed)
PT calling again. A little upset no call back. Please call back to advise. I read the last notes to her but she is very distressed over these recommendations due to medicare only and limited income.   I already asked her to call her Pharmacy to check the price on the two suggestions but she went on about limited income and only medicare.  PT # is [phone]

## 2022-08-02 NOTE — Telephone Encounter (Signed)
Spoke with the pt and notified of response per pharm team  She is going to call her ins and see how close she is to meeting her deductible  Will call back if needed

## 2022-08-03 ENCOUNTER — Telehealth (HOSPITAL_COMMUNITY): Payer: Self-pay

## 2022-08-03 NOTE — Telephone Encounter (Signed)
Pt is not interested at the moment for pulmonary rehab. Closed referral.

## 2023-01-07 ENCOUNTER — Encounter: Payer: Self-pay | Admitting: Primary Care

## 2023-01-07 ENCOUNTER — Ambulatory Visit: Payer: Medicare Other | Admitting: Primary Care

## 2023-01-07 VITALS — BP 124/62 | HR 68 | Temp 98.1°F | Ht 62.5 in | Wt 99.6 lb

## 2023-01-07 DIAGNOSIS — J449 Chronic obstructive pulmonary disease, unspecified: Secondary | ICD-10-CM | POA: Diagnosis not present

## 2023-01-07 DIAGNOSIS — J9611 Chronic respiratory failure with hypoxia: Secondary | ICD-10-CM | POA: Diagnosis not present

## 2023-01-07 NOTE — Patient Instructions (Addendum)
Recommendations: Continue Spiriva 2.75mcg two puffs daily   Orders: Refer to pulmonary rehab (done) Pulmonary function testing (done)  Follow-up: 3-4 months with 1 hour PFT prior with Dr. Francine Graven    How to Use an Incentive Spirometer An incentive spirometer is a tool that measures how well you are filling your lungs with each breath. Learning to take long, deep breaths using this tool can help you keep your lungs clear and active. This may help to reverse or lessen your chance of developing breathing (pulmonary) problems, especially infection. You may be asked to use a spirometer: After a surgery. If you have a lung problem or a history of smoking. After a long period of time when you have been unable to move or be active. If the spirometer includes an indicator to show the highest number that you have reached, your health care provider or respiratory therapist will help you set a goal. Keep a log of your progress as told by your health care provider. What are the risks? Breathing too quickly may cause dizziness or cause you to pass out. Take your time so you do not get dizzy or light-headed. If you are in pain, you may need to take pain medicine before doing incentive spirometry. It is harder to take a deep breath if you are having pain. How to use your incentive spirometer  Sit up on the edge of your bed or on a chair. Hold the incentive spirometer so that it is in an upright position. Before you use the spirometer, breathe out normally. Place the mouthpiece in your mouth. Make sure your lips are closed tightly around it. Breathe in slowly and as deeply as you can through your mouth, causing the piston or the ball to rise toward the top of the chamber. Hold your breath for 3-5 seconds, or for as long as possible. If the spirometer includes a coach indicator, use this to guide you in breathing. Slow down your breathing if the indicator goes above the marked areas. Remove the mouthpiece  from your mouth and breathe out normally. The piston or ball will return to the bottom of the chamber. Rest for a few seconds, then repeat the steps 10 or more times. Take your time and take a few normal breaths between deep breaths so that you do not get dizzy or light-headed. Do this every 1-2 hours when you are awake. If the spirometer includes a goal marker to show the highest number you have reached (best effort), use this as a goal to work toward during each repetition. After each set of 10 deep breaths, cough a few times. This will help to make sure that your lungs are clear. If you have an incision on your chest or abdomen from surgery, place a pillow or a rolled-up towel firmly against the incision when you cough. This can help to reduce pain while taking deep breaths and coughing. General tips When you are able to get out of bed: Walk around often. Continue to take deep breaths and cough in order to clear your lungs. Keep using the incentive spirometer until your health care provider says it is okay to stop using it. If you have been in the hospital, you may be told to keep using the spirometer at home. Contact a health care provider if: You are having difficulty using the spirometer. You have trouble using the spirometer as often as instructed. Your pain medicine is not giving enough relief for you to use the spirometer as  told. You have a fever. Get help right away if: You develop shortness of breath. You develop a cough with bloody mucus from the lungs. You have fluid or blood coming from an incision site after you cough. Summary An incentive spirometer is a tool that can help you learn to take long, deep breaths to keep your lungs clear and active. You may be asked to use a spirometer after a surgery, if you have a lung problem or a history of smoking, or if you have been inactive for a long period of time. Use your incentive spirometer as instructed every 1-2 hours while you  are awake. If you have an incision on your chest or abdomen, place a pillow or a rolled-up towel firmly against your incision when you cough. This will help to reduce pain. Get help right away if you have shortness of breath, you cough up bloody mucus, or blood comes from your incision when you cough. This information is not intended to replace advice given to you by your health care provider. Make sure you discuss any questions you have with your health care provider. Document Revised: 08/03/2019 Document Reviewed: 08/03/2019 Elsevier Patient Education  2024 Elsevier Inc.   COPD and Physical Activity Chronic obstructive pulmonary disease (COPD) is a long-term, or chronic, condition that affects the lungs. COPD is a general term that can be used to describe many problems that cause inflammation of the lungs and limit airflow. These conditions include chronic bronchitis and emphysema. The main symptom of COPD is shortness of breath, which makes it harder to do even simple tasks. This can also make it harder to exercise and stay active. Talk with your health care provider about treatments to help you breathe better and actions you can take to prevent breathing problems during physical activity. What are the benefits of exercising when you have COPD? Exercising regularly is an important part of a healthy lifestyle. You can still exercise and do physical activities even though you have COPD. Exercise and physical activity improve your shortness of breath by increasing blood flow (circulation). This causes your heart to pump more oxygen through your body. Moderate exercise can: Improve oxygen use. Increase your energy level. Help with shortness of breath. Strengthen your breathing muscles. Improve heart health. Help with sleep. Improve your self-esteem and feelings of self-worth. Lower depression, stress, and anxiety. Exercise can benefit everyone with COPD. The severity of your disease may affect  how hard you can exercise, especially at first, but everyone can benefit. Talk with your health care provider about how much exercise is safe for you, and which activities and exercises are safe for you. What actions can I take to prevent breathing problems during physical activity? Sign up for a pulmonary rehabilitation program. This type of program may include: Education about lung diseases. Exercise classes that teach you how to exercise and be more active while improving your breathing. This usually involves: Exercise using your lower extremities, such as a stationary bicycle. About 30 minutes of exercise, 2 to 5 times per week, for 6 to 12 weeks. Strength training, such as push-ups or leg lifts. Nutrition education. Group classes in which you can talk with others who also have COPD and learn ways to manage stress. If you use an oxygen tank, you should use it while you exercise. Work with your health care provider to adjust your oxygen for your physical activity. Your resting flow rate is different from your flow rate during physical activity. How to manage  your breathing while exercising While you are exercising: Take slow breaths. Pace yourself, and do nottry to go too fast. Purse your lips while breathing out. Pursing your lips is similar to a kissing or whistling position. If doing exercise that uses a quick burst of effort, such as weight lifting: Breathe in before starting the exercise. Breathe out during the hardest part of the exercise, such as raising the weights. Where to find support You can find support for exercising with COPD from: Your health care provider. A pulmonary rehabilitation program. Your local health department or community health programs. Support groups, either online or in-person. Your health care provider may be able to recommend support groups. Where to find more information You can find more information about exercising with COPD from: American Lung  Association: lung.org COPD Foundation: copdfoundation.org Contact a health care provider if: Your symptoms get worse. You have nausea. You have a fever. You want to start a new exercise program or a new activity. Get help right away if: You have chest pain. You cannot breathe. These symptoms may represent a serious problem that is an emergency. Do not wait to see if the symptoms will go away. Get medical help right away. Call your local emergency services (911 in the [place] Do not drive yourself to the hospital. Summary COPD is a general term that can be used to describe many different lung problems that cause lung inflammation and limit airflow. This includes chronic bronchitis and emphysema. Exercise and physical activity improve your shortness of breath by increasing blood flow (circulation). This causes your heart to provide more oxygen to your body. Contact your health care provider before starting any exercise program or new activity. Ask your health care provider what exercises and activities are safe for you. This information is not intended to replace advice given to you by your health care provider. Make sure you discuss any questions you have with your health care provider. Document Revised: 03/22/2020 Document Reviewed: 03/22/2020 Elsevier Patient Education  2024 [employer].

## 2023-01-07 NOTE — Progress Notes (Signed)
@Patient  ID: Victoria Fitzgerald, female    DOB: 10/19/1940, 82 y.o.   MRN: [id number]  Chief Complaint  Patient presents with   Follow-up    Doing well on 2L oxygen.  Tries to go without oxygen for short periods of time while at home.    Referring provider: No ref. provider found  HPI: 82 year old female, former smoker quit 2021. PMH COPD, hypertension, anemia, coronary artery disease.  Patient of Dr. Francine Fitzgerald, seen for initial consult on 07/18/2022.  Previous [facility] pulmonary encounter: 07/18/22- Dr. Clide Deutscher Fitzgerald is a 82 year old woman, former smoker with COPD and chronic respiratory failure who is referred to pulmonary clinic.   She quit smoking at the end of 2021 after having covid in which she was hospitalized for 9 days. She was discharged on oxygen at that time and then was only using it at night until she was treated for covid again December 2023 and is now using oxygen throughout the day/night. She has exertional dyspnea and decreased appetite with weight loss. She denies cough or wheezing. She has albuterol rescue inhaler which she rarely uses.   She has 45 pack year history. She is widowed. She is accompanied by her son today.  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 She would like to return to work.   01/07/2023 Patient presents today for 11-month follow-up/COPD and chronic respiratory failure. Former smoker, quit smoking in 2021. During last office visit she was ordered for pulmonary function testing and referred to pulmonary rehab. Spiriva has been working well for her. Requiring oxygen 24/7 since she had heart attack in May. Maintained on 2 L of oxygen. She feels limited by her oxygen requirements. Goal is to get back to work, she previously worked part time at a [employer]    No Known Allergies  Immunization History  Administered Date(s) Administered   Fluad Quad(high Dose 65+) 02/24/2020, 03/22/2022   PFIZER(Purple Top)SARS-COV-2 Vaccination 07/04/2019,  07/25/2019    Past Medical History:  Diagnosis Date   Hypertension    Pneumonia due to COVID-19 virus 2022    Tobacco History: Social History   Tobacco Use  Smoking Status Former   Current packs/day: 0.00   Types: Cigarettes   Quit date: 05/29/2019   Years since quitting: 3.6   Passive exposure: Past  Smokeless Tobacco Never   Counseling given: Not Answered   Outpatient Medications Prior to Visit  Medication Sig Dispense Refill   acetaminophen (TYLENOL) 500 MG tablet Take 500 mg by mouth every 4 (four) hours as needed for mild pain or moderate pain.     amLODipine (NORVASC) 10 MG tablet Take 1 tablet by mouth daily.     aspirin 81 MG chewable tablet Chew 81 mg by mouth daily.     BRILINTA 90 MG TABS tablet Take 90 mg by mouth 2 (two) times daily.     Cholecalciferol 125 MCG (5000 UT) capsule Take 1 capsule by mouth daily.     famotidine (PEPCID) 20 MG tablet Take 20 mg by mouth 2 (two) times daily.     isosorbide mononitrate (IMDUR) 60 MG 24 hr tablet Take 60 mg by mouth daily.     metoprolol tartrate (LOPRESSOR) 25 MG tablet Take 25 mg by mouth 2 (two) times daily.     nitroGLYCERIN (NITROSTAT) 0.4 MG SL tablet Place 0.4 mg under the tongue every 5 (five) minutes as needed.     pravastatin (PRAVACHOL) 40 MG tablet Take 40 mg by mouth  daily.     Tiotropium Bromide Monohydrate (SPIRIVA RESPIMAT) 2.5 MCG/ACT AERS Inhale 2 puffs into the lungs daily. 4 g 6   valsartan (DIOVAN) 80 MG tablet Take 1 tablet by mouth daily.     VENTOLIN HFA 108 (90 Base) MCG/ACT inhaler Inhale 1-2 puffs into the lungs every 6 (six) hours as needed.     metoprolol tartrate (LOPRESSOR) 25 MG tablet Take 25 mg by mouth daily.     pravastatin (PRAVACHOL) 10 MG tablet Take 10 mg by mouth daily.     No facility-administered medications prior to visit.   Review of Systems  Review of Systems  Constitutional: Negative.   HENT: Negative.    Respiratory:  Positive for shortness of breath. Negative for  cough and wheezing.   Cardiovascular: Negative.    Physical Exam  BP 124/62 (BP Location: Right Arm, Patient Position: Sitting, Cuff Size: Normal)   Pulse 68   Temp 98.1 F (36.7 C) (Oral)   Ht 5' 2.5" (1.588 m)   Wt 99 lb 9.6 oz (45.2 kg)   SpO2 97% Comment: 1.5 L continuous oxygen (tank)  BMI 17.93 kg/m  Physical Exam Constitutional:      General: She is not in acute distress.    Appearance: Normal appearance. She is not ill-appearing.  HENT:     Head: Normocephalic and atraumatic.     Mouth/Throat:     Mouth: Mucous membranes are moist.     Pharynx: Oropharynx is clear.  Cardiovascular:     Rate and Rhythm: Normal rate and regular rhythm.  Pulmonary:     Effort: Pulmonary effort is normal.     Breath sounds: Rhonchi present. No wheezing or rales.  Skin:    General: Skin is warm and dry.  Neurological:     General: No focal deficit present.     Mental Status: She is alert and oriented to person, place, and time. Mental status is at baseline.  Psychiatric:        Mood and Affect: Mood normal.        Behavior: Behavior normal.        Thought Content: Thought content normal.        Judgment: Judgment normal.      Lab Results:  CBC    Component Value Date/Time   WBC 11.2 (H) 06/30/2008 1231   RBC 4.48 06/30/2008 1231   HGB 14.6 06/30/2008 1231   HCT 41.7 06/30/2008 1231   PLT 267 06/30/2008 1231   MCV 93.2 06/30/2008 1231   MCHC 35.1 06/30/2008 1231   RDW 13.8 06/30/2008 1231   LYMPHSABS 2.5 06/30/2008 1231   MONOABS 0.5 06/30/2008 1231   EOSABS 0.0 06/30/2008 1231   BASOSABS 0.1 06/30/2008 1231    BMET    Component Value Date/Time   NA 139 06/30/2008 1231   K 3.8 06/30/2008 1231   CL 104 06/30/2008 1231   CO2 26 06/30/2008 1231   GLUCOSE 109 (H) 06/30/2008 1231   BUN 6 06/30/2008 1231   CREATININE 0.73 06/30/2008 1231   CALCIUM 9.1 06/30/2008 1231   GFRNONAA >60 06/30/2008 1231   GFRAA  06/30/2008 1231    >60        The eGFR has been  calculated using the MDRD equation. This calculation has not been validated in all clinical situations. eGFR's persistently <60 mL/min signify possible Chronic Kidney Disease.    BNP No results found for: "BNP"  ProBNP No results found for: "PROBNP"  Imaging: No results found.  Assessment & Plan:   COPD (chronic obstructive pulmonary disease) (HCC) - Patient reports clinical improvement from Spiriva Respimat 2.73mcg two puff daily. Needs pulmonary function testing. Encourage patient use incentive spirometer hourly while awake.   Chronic respiratory failure with hypoxia [facility]) - She has been on oxygen since having NSTEMI in May 2024. She feels limited by oxygen requirements. Needs to wear 2L oxygen with exertion. Referring to pulmonary rehab. Qualified for POC/ DME order placed. Her main goal is to get back to work part time.    Recommendations: Continue Spiriva 2.34mcg two puffs daily   Orders: Refer to pulmonary rehab (done) Pulmonary function testing (done)  Follow-up: 3-4 months with 1 hour PFT prior with Dr. Ellin Goodie, NP 01/13/2023

## 2023-01-09 DIAGNOSIS — J9611 Chronic respiratory failure with hypoxia: Secondary | ICD-10-CM

## 2023-01-11 NOTE — Telephone Encounter (Signed)
Patient is in need of POC. Can we order one for the patient? She uses [facility].

## 2023-01-11 NOTE — Telephone Encounter (Signed)
Amy can you put POC order in, I dont think it was entered. We did walk her during OV on Monday 8/12 and she did 1 lap and was able to maintain O2 saturation 90%. Patient can follow-up with [facility] next week to make sure they have received order                                         07/18/2022     01/07/2023    PUL Pulse Oximetry                                                 Use Oxygen?                 No                 Yes               O2 flow                                                2 L/min         Type                                                     Pulse            Resting heart rate          75                  69                 Heart rate after lap 1     85                  82                 SpO2 after lap 1            90                  85                 SpO2 after lap 2                                  --                  SpO2 after lap 3                                  --

## 2023-01-13 DIAGNOSIS — J449 Chronic obstructive pulmonary disease, unspecified: Secondary | ICD-10-CM | POA: Insufficient documentation

## 2023-01-13 DIAGNOSIS — J9611 Chronic respiratory failure with hypoxia: Secondary | ICD-10-CM | POA: Insufficient documentation

## 2023-01-13 NOTE — Assessment & Plan Note (Addendum)
-   Patient reports clinical improvement from Spiriva Respimat 2.39mcg two puff daily. Needs pulmonary function testing. Encourage patient use incentive spirometer hourly while awake.

## 2023-01-13 NOTE — Assessment & Plan Note (Addendum)
-   She has been on oxygen since having NSTEMI in May 2024. She feels limited by oxygen requirements. Needs to wear 2L oxygen with exertion. Referring to pulmonary rehab. Qualified for POC/ DME order placed. Her main goal is to get back to work part time.

## 2023-01-17 ENCOUNTER — Telehealth (HOSPITAL_COMMUNITY): Payer: Self-pay

## 2023-01-17 NOTE — Telephone Encounter (Signed)
Pt is not interested in the pulmonary rehab program. Closed referral. 

## 2023-02-08 ENCOUNTER — Telehealth (HOSPITAL_COMMUNITY): Payer: Self-pay

## 2023-02-08 ENCOUNTER — Encounter (HOSPITAL_COMMUNITY): Payer: Self-pay

## 2023-02-08 NOTE — Telephone Encounter (Signed)
Pt's son called and stated that pt is interested in scheduling for pulmonary rehab. Pt will come in for orientation on 9/20@1pm  and will attend the 1:15 exercise class time.   Sent package

## 2023-02-08 NOTE — Telephone Encounter (Signed)
Pt's son called back and stated pt would like to change class times to 10:15 class time for pulmonary rehab.

## 2023-02-08 NOTE — Telephone Encounter (Signed)
Pt insurance is active and benefits verified through Medicare a/b Co-pay 0, DED $240/$240 met, out of pocket 0/0 met, co-insurance 20%. no pre-authorization required.

## 2023-02-14 ENCOUNTER — Telehealth (HOSPITAL_COMMUNITY): Payer: Self-pay | Admitting: *Deleted

## 2023-02-14 NOTE — Telephone Encounter (Signed)
In preparation for tomorrow's appt for pulmonary rehab orientation, pt had NSTEMI and DES x 3 at [facility] in May 2024.  Reviewed notes in CareEverywhere. Pt is eligible to participate in Cardiac rehab.  Looks as though Cardiac rehab was mentioned in the follow up note on 5/30 but unfortunately we did not receive the referral here at [facility].   Called and left message for [facility] who called right back.  Advised him that Manasa should do Cardiac rehab first and can assess if it would be beneficial for her to transition into the pulmonary rehab program upon the completion of Cardiac rehab.  Explained to [facility] the Cardiac rehab program and insurance benefits. Verbalized understanding.  Will send paper referral form to Dr Dorthula Nettles at [place] along with request for 12 lead EKG tracing.

## 2023-02-15 ENCOUNTER — Ambulatory Visit (HOSPITAL_COMMUNITY): Payer: Medicare Other

## 2023-02-19 ENCOUNTER — Telehealth (HOSPITAL_COMMUNITY): Payer: Self-pay

## 2023-02-19 NOTE — Telephone Encounter (Addendum)
Received Documents from [facility]. Did verify that this is the correct pt as they do have her in their system as Victoria Fitzgerald. Her ID on epic says Victoria Fitzgerald as well as one of her preferred names.  While viewing the documents from [place], they did not send an EKG.

## 2023-02-19 NOTE — Telephone Encounter (Signed)
Called Dr.Saliashvili office and spoke with Steward Drone. Steward Drone said she is going to fax over the EKG in a few moments.

## 2023-02-21 ENCOUNTER — Ambulatory Visit (HOSPITAL_COMMUNITY): Payer: Medicare Other

## 2023-02-22 ENCOUNTER — Telehealth (HOSPITAL_COMMUNITY): Payer: Self-pay

## 2023-02-22 NOTE — Telephone Encounter (Signed)
Pt insurance is active and benefits verified through Medicare. Co-pay $0.00, DED $240.00/$240.00 met, out of pocket $0.00/$0.00 met, co-insurance 20%. No pre-authorization required. Passport, 02/22/23 @ 2:35PM, REF#[id number]   How many CR sessions are covered? (36 visits for TCR, 72 visits for ICR)72 Is this a lifetime maximum or an annual maximum? Lifetime Has the member used any of these services to date? No Is there a time limit (weeks/months) on start of program and/or program completion? No

## 2023-02-22 NOTE — Telephone Encounter (Signed)
Called and spoke with pt son Moise Boring, who stated pt is interested in CR. Patient will come in for orientation on 10/3 @ 1:15PM and will attend the 1:45PM (M&F) exercise class.   Pensions consultant.

## 2023-02-26 ENCOUNTER — Ambulatory Visit (HOSPITAL_COMMUNITY): Payer: Medicare Other

## 2023-02-28 ENCOUNTER — Ambulatory Visit (HOSPITAL_COMMUNITY): Payer: Medicare Other

## 2023-03-01 ENCOUNTER — Telehealth (HOSPITAL_COMMUNITY): Payer: Self-pay

## 2023-03-01 NOTE — Telephone Encounter (Signed)
Called and spoke with pt son Moise Boring in regards to rescheduling CR for pt. Patient will come in for orientation on 03/14/23 at 10:30AM and will attend the 1:45PM  exercise class.   Pensions consultant.

## 2023-03-04 ENCOUNTER — Ambulatory Visit (HOSPITAL_COMMUNITY): Payer: Medicare Other

## 2023-03-05 ENCOUNTER — Ambulatory Visit (HOSPITAL_COMMUNITY): Payer: Medicare Other

## 2023-03-07 ENCOUNTER — Ambulatory Visit (HOSPITAL_COMMUNITY): Payer: Medicare Other

## 2023-03-08 ENCOUNTER — Ambulatory Visit (HOSPITAL_COMMUNITY): Payer: Medicare Other

## 2023-03-11 ENCOUNTER — Ambulatory Visit (HOSPITAL_COMMUNITY): Payer: Medicare Other

## 2023-03-11 ENCOUNTER — Other Ambulatory Visit: Payer: Self-pay | Admitting: Pulmonary Disease

## 2023-03-12 ENCOUNTER — Ambulatory Visit (HOSPITAL_COMMUNITY): Payer: Medicare Other

## 2023-03-14 ENCOUNTER — Encounter (HOSPITAL_COMMUNITY)
Admission: RE | Admit: 2023-03-14 | Discharge: 2023-03-14 | Disposition: A | Discharge status: Home / Self Care | Payer: Medicare Other | Source: Ambulatory Visit | Attending: Cardiology | Admitting: Cardiology

## 2023-03-14 ENCOUNTER — Ambulatory Visit (HOSPITAL_COMMUNITY): Payer: Medicare Other

## 2023-03-14 VITALS — BP 122/62 | HR 65 | Ht 62.0 in | Wt 100.8 lb

## 2023-03-14 DIAGNOSIS — I214 Non-ST elevation (NSTEMI) myocardial infarction: Secondary | ICD-10-CM

## 2023-03-14 DIAGNOSIS — Z48812 Encounter for surgical aftercare following surgery on the circulatory system: Secondary | ICD-10-CM | POA: Insufficient documentation

## 2023-03-14 DIAGNOSIS — I252 Old myocardial infarction: Secondary | ICD-10-CM | POA: Insufficient documentation

## 2023-03-14 DIAGNOSIS — Z955 Presence of coronary angioplasty implant and graft: Secondary | ICD-10-CM | POA: Insufficient documentation

## 2023-03-14 NOTE — Progress Notes (Signed)
Cardiac Individual Treatment Plan  Patient Details  Name: Victoria Fitzgerald MRN: [id number] Date of Birth: 12/17/1940 Referring Provider:   Flowsheet Row INTENSIVE CARDIAC REHAB ORIENT from 03/14/2023 in [facility]  Referring Provider Dr Wille Glaser, MD (Dr. 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  Patient's Home Medications on Admission:  Current Outpatient Medications:    acetaminophen (TYLENOL) 500 MG tablet, Take 500 mg by mouth every 4 (four) hours as needed for mild pain or moderate pain., Disp: , Rfl:    amLODipine (NORVASC) 10 MG tablet, Take 0.5 tablets by mouth daily., Disp: , Rfl:    aspirin 81 MG chewable tablet, Chew 81 mg by mouth daily., Disp: , Rfl:    BRILINTA 90 MG TABS tablet, Take 90 mg by mouth 2 (two) times daily., Disp: , Rfl:    Cholecalciferol 125 MCG (5000 UT) capsule, Take 1 capsule by mouth daily., Disp: , Rfl:    famotidine (PEPCID) 20 MG tablet, Take 20 mg by mouth 2 (two) times daily., Disp: , Rfl:    isosorbide mononitrate (IMDUR) 60 MG 24 hr tablet, Take 60 mg by mouth daily., Disp: , Rfl:    metoprolol tartrate (LOPRESSOR) 25 MG tablet, Take 25 mg by mouth 2 (two) times daily., Disp: , Rfl:    nitroGLYCERIN (NITROSTAT) 0.4 MG SL tablet, Place 0.4 mg under the tongue every 5 (five) minutes as needed., Disp: , Rfl:    pravastatin (PRAVACHOL) 40 MG tablet, Take 20 mg by mouth daily., Disp: , Rfl:    Tiotropium Bromide Monohydrate (SPIRIVA RESPIMAT) 2.5 MCG/ACT AERS, INHALE 2 PUFFS BY MOUTH INTO THE LUNGS DAILY, Disp: 4 g, Rfl: 11   valsartan (DIOVAN) 80 MG tablet, Take 120 mg by mouth daily., Disp: , Rfl:    VENTOLIN HFA 108 (90 Base) MCG/ACT inhaler, Inhale 1-2 puffs  into the lungs every 6 (six) hours as needed., Disp: , Rfl:   Past Medical History: Past Medical History:  Diagnosis Date   Hypertension    Pneumonia due to COVID-19 virus 2022    Tobacco Use: Social History   Tobacco Use  Smoking Status Former   Current packs/day: 0.00   Types: Cigarettes   Quit date: 05/29/2019   Years since quitting: 3.7   Passive exposure: Past  Smokeless Tobacco Never    Labs: Review Flowsheet       Latest Ref Rng & Units 05/26/2008 07/01/2008  Labs for ITP Cardiac and Pulmonary Rehab  Cholestrol 0 - 200 mg/dL - 865        ATP III CLASSIFICATION:  <200     mg/dL   Desirable  784-696  mg/dL   Borderline High  >=295    mg/dL   High          LDL (calc) 0 - 99 mg/dL - 284        Total Cholesterol/HDL:CHD Risk Coronary Heart Disease Risk Table                     Men   Women  1/2 Average Risk   3.4   3.3  Average Risk       5.0   4.4  2 X  Average Risk   9.6   7.1  3 X Average Risk  23.4   11.0        Use the calculated Patient Ratio above and the CHD Risk Table to determine the patient's CHD Risk.        ATP III CLASSIFICATION (LDL):  <100     mg/dL   Optimal  409-811  mg/dL   Near or Above                    Optimal  130-159  mg/dL   Borderline  914-782  mg/dL   High  >956     mg/dL   Very High   HDL-C >21 mg/dL - 62   Trlycerides <308 mg/dL - 58   TCO2 0 - 657 mmol/L 29  -    Details            Capillary Blood Glucose: No results found for: "GLUCAP"   Exercise Target Goals: Exercise Program Goal: Individual exercise prescription set using results from initial 6 min walk test and THRR while considering  patient's activity barriers and safety.   Exercise Prescription Goal: Initial exercise prescription builds to 30-45 minutes a day of aerobic activity, 2-3 days per week.  Home exercise guidelines will be given to patient during program as part of exercise prescription that the participant will acknowledge.  Activity Barriers &  Risk Stratification:  Activity Barriers & Cardiac Risk Stratification - 03/14/23 1400       Activity Barriers & Cardiac Risk Stratification   Activity Barriers Joint Problems;Right Hip Replacement;Left Hip Replacement;Shortness of Breath;Deconditioning;Back Problems;Balance Concerns;Neck/Spine Problems    Cardiac Risk Stratification High             6 Minute Walk:  6 Minute Walk     Row Name 03/14/23 1351         6 Minute Walk   Phase Initial     Distance 630 feet     Walk Time 6 minutes     # of Rest Breaks 0     MPH 1.93     METS 1.48     RPE 13     Perceived Dyspnea  0     VO2 Peak 5.18     Symptoms Yes (comment)     Comments back pain 2/10 chronic not resolved with rest. SOB 1DYS resolved with rest     Resting HR 65 bpm     Resting BP 122/62     Resting Oxygen Saturation  99 %     Exercise Oxygen Saturation  during 6 min walk 96 %     Max Ex. HR 76 bpm     Max Ex. BP 152/75     2 Minute Post BP 132/72       Interval Oxygen   Interval Oxygen? Yes     Baseline Oxygen Saturation % 99 %     1 Minute Oxygen Saturation % 98 %     1 Minute Liters of Oxygen 2 L     2 Minute Oxygen Saturation % 97 %     2 Minute Liters of Oxygen 2 L     3 Minute Oxygen Saturation % 96 %     3 Minute Liters of Oxygen 2 L     4 Minute Oxygen Saturation % 96 %     4 Minute Liters of Oxygen 2 L     5 Minute Oxygen Saturation % 96 %  5 Minute Liters of Oxygen 2 L     6 Minute Oxygen Saturation % 96 %     6 Minute Liters of Oxygen 2 L     2 Minute Post Oxygen Saturation % 98 %     2 Minute Post Liters of Oxygen 2 L              Oxygen Initial Assessment:   Oxygen Re-Evaluation:   Oxygen Discharge (Final Oxygen Re-Evaluation):   Initial Exercise Prescription:  Initial Exercise Prescription - 03/14/23 1400       Date of Initial Exercise RX and Referring Provider   Date 03/14/23    Referring Provider Dr Wille Glaser, MD (Dr. Armanda Magic MD covering)     Expected Discharge Date 06/06/22      Oxygen   Oxygen Continuous    Liters 2    Maintain Oxygen Saturation 88% or higher      NuStep   Level 1    SPM 60    Minutes 20    METs 1.4      Prescription Details   Frequency (times per week) 3    Duration Progress to 30 minutes of continuous aerobic without signs/symptoms of physical distress      Intensity   THRR 40-80% of Max Heartrate 56-113    Ratings of Perceived Exertion 11-13    Perceived Dyspnea 0-4      Progression   Progression Continue progressive overload as per policy without signs/symptoms or physical distress.      Resistance Training   Training Prescription Yes    Weight 1    Reps 10-15             Perform Capillary Blood Glucose checks as needed.  Exercise Prescription Changes:   Exercise Comments:   Exercise Goals and Review:   Exercise Goals     Row Name 03/14/23 1122             Exercise Goals   Increase Physical Activity Yes       Intervention Provide advice, education, support and counseling about physical activity/exercise needs.;Develop an individualized exercise prescription for aerobic and resistive training based on initial evaluation findings, risk stratification, comorbidities and participant's personal goals.       Expected Outcomes Short Term: Attend rehab on a regular basis to increase amount of physical activity.;Long Term: Exercising regularly at least 3-5 days a week.;Long Term: Add in home exercise to make exercise part of routine and to increase amount of physical activity.       Increase Strength and Stamina Yes       Intervention Provide advice, education, support and counseling about physical activity/exercise needs.;Develop an individualized exercise prescription for aerobic and resistive training based on initial evaluation findings, risk stratification, comorbidities and participant's personal goals.       Expected Outcomes Short Term: Increase workloads from initial  exercise prescription for resistance, speed, and METs.;Short Term: Perform resistance training exercises routinely during rehab and add in resistance training at home;Long Term: Improve cardiorespiratory fitness, muscular endurance and strength as measured by increased METs and functional capacity ( )       Able to understand and use rate of perceived exertion (RPE) scale Yes       Intervention Provide education and explanation on how to use RPE scale       Expected Outcomes Short Term: Able to use RPE daily in rehab to express subjective intensity level;Long Term:  Able to use RPE to  guide intensity level when exercising independently       Able to understand and use Dyspnea scale Yes       Intervention Provide education and explanation on how to use Dyspnea scale       Expected Outcomes Short Term: Able to use Dyspnea scale daily in rehab to express subjective sense of shortness of breath during exertion;Long Term: Able to use Dyspnea scale to guide intensity level when exercising independently       Knowledge and understanding of Target Heart Rate Range (THRR) Yes       Intervention Provide education and explanation of THRR including how the numbers were predicted and where they are located for reference       Expected Outcomes Short Term: Able to use daily as guideline for intensity in rehab;Long Term: Able to use THRR to govern intensity when exercising independently;Short Term: Able to state/look up THRR       Understanding of Exercise Prescription Yes       Intervention Provide education, explanation, and written materials on patient's individual exercise prescription       Expected Outcomes Long Term: Able to explain home exercise prescription to exercise independently;Short Term: Able to explain program exercise prescription                Exercise Goals Re-Evaluation :   Discharge Exercise Prescription (Final Exercise Prescription Changes):   Nutrition:  Target Goals:  Understanding of nutrition guidelines, daily intake of sodium 1500mg , cholesterol 200mg , calories 30% from fat and 7% or less from saturated fats, daily to have 5 or more servings of fruits and vegetables.  Biometrics:  Pre Biometrics - 03/14/23 1347       Pre Biometrics   Waist Circumference 32.5 inches    Hip Circumference 34 inches    Waist to Hip Ratio 0.96 %    Triceps Skinfold 10 mm    % Body Fat 29.2 %    Grip Strength 22 kg    Flexibility --   Did not attempt, chronic back pain   Single Leg Stand 16 seconds              Nutrition Therapy Plan and Nutrition Goals:   Nutrition Assessments:  MEDIFICTS Score Key: >=70 Need to make dietary changes  40-70 Heart Healthy Diet <= 40 Therapeutic Level Cholesterol Diet    Picture Your Plate Scores: <16 Unhealthy dietary pattern with much room for improvement. 41-50 Dietary pattern unlikely to meet recommendations for good health and room for improvement. 51-60 More healthful dietary pattern, with some room for improvement.  >60 Healthy dietary pattern, although there may be some specific behaviors that could be improved.    Nutrition Goals Re-Evaluation:   Nutrition Goals Re-Evaluation:   Nutrition Goals Discharge (Final Nutrition Goals Re-Evaluation):   Psychosocial: Target Goals: Acknowledge presence or absence of significant depression and/or stress, maximize coping skills, provide positive support system. Participant is able to verbalize types and ability to use techniques and skills needed for reducing stress and depression.  Initial Review & Psychosocial Screening:  Initial Psych Review & Screening - 03/14/23 1405       Initial Review   Current issues with Current Anxiety/Panic;Current Sleep Concerns;Current Stress Concerns    Source of Stress Concerns Family;Financial;Occupation;Retirement/disability;Chronic Illness    Comments She does endorse some stress and anxiety about her SOB and becomes  obsessive with checking her oxygen, but overall she feels she's coping ok for now.      Family  Dynamics   Good Support System? Yes   Son and daughter in law are good support. She is able to maintain living alone for now though     Barriers   Psychosocial barriers to participate in program The patient should benefit from training in stress management and relaxation.;Psychosocial barriers identified (see note)   She does not want assistance at this time. She does know she can reach out at any time if she changes her mind     Screening Interventions   Interventions Encouraged to exercise;To provide support and resources with identified psychosocial needs;Provide feedback about the scores to participant    Expected Outcomes Long Term Goal: Stressors or current issues are controlled or eliminated.;Short Term goal: Identification and review with participant of any Quality of Life or Depression concerns found by scoring the questionnaire.;Long Term goal: The participant improves quality of Life and PHQ9 Scores as seen by post scores and/or verbalization of changes             Quality of Life Scores:  Quality of Life - 03/14/23 1409       Quality of Life   Select Quality of Life      Quality of Life Scores   Health/Function Pre 17 %    Socioeconomic Pre 21.33 %    Psych/Spiritual Pre 29.14 %    Family Pre 28.5 %    GLOBAL Pre 22.23 %            Scores of 19 and below usually indicate a poorer quality of life in these areas.  A difference of  2-3 points is a clinically meaningful difference.  A difference of 2-3 points in the total score of the Quality of Life Index has been associated with significant improvement in overall quality of life, self-image, physical symptoms, and general health in studies assessing change in quality of life.  PHQ-9: Review Flowsheet       03/14/2023  Depression screen PHQ 2/9  Decreased Interest 1  Down, Depressed, Hopeless 0  PHQ - 2 Score 1   Altered sleeping 1  Tired, decreased energy 2  Change in appetite 0  Feeling bad or failure about yourself  0  Trouble concentrating 2  Moving slowly or fidgety/restless 0  Suicidal thoughts 0  PHQ-9 Score 6  Difficult doing work/chores --    Details           Interpretation of Total Score  Total Score Depression Severity:  1-4 = Minimal depression, 5-9 = Mild depression, 10-14 = Moderate depression, 15-19 = Moderately severe depression, 20-27 = Severe depression   Psychosocial Evaluation and Intervention:   Psychosocial Re-Evaluation:   Psychosocial Discharge (Final Psychosocial Re-Evaluation):   Vocational Rehabilitation: Provide vocational rehab assistance to qualifying candidates.   Vocational Rehab Evaluation & Intervention:  Vocational Rehab - 03/14/23 1412       Initial Vocational Rehab Evaluation & Intervention   Assessment shows need for Vocational Rehabilitation No   She wants to return to work at the [employer] shop, but wants to see if she can increase strength and stamina before returning. No issues in obtaining a job            Education: Education Goals: Education classes will be provided on a weekly basis, covering required topics. Participant will state understanding/return demonstration of topics presented.     Core Videos: Exercise    Move It!  Clinical staff conducted group or individual video education with verbal and written material and guidebook.  Patient learns the recommended Pritikin exercise program. Exercise with the goal of living a long, healthy life. Some of the health benefits of exercise include controlled diabetes, healthier blood pressure levels, improved cholesterol levels, improved heart and lung capacity, improved sleep, and better body composition. Everyone should speak with their doctor before starting or changing an exercise routine.  Biomechanical Limitations Clinical staff conducted group or individual video education  with verbal and written material and guidebook.  Patient learns how biomechanical limitations can impact exercise and how we can mitigate and possibly overcome limitations to have an impactful and balanced exercise routine.  Body Composition Clinical staff conducted group or individual video education with verbal and written material and guidebook.  Patient learns that body composition (ratio of muscle mass to fat mass) is a key component to assessing overall fitness, rather than body weight alone. Increased fat mass, especially visceral belly fat, can put [place] at increased risk for metabolic syndrome, type 2 diabetes, heart disease, and even death. It is recommended to combine diet and exercise (cardiovascular and resistance training) to improve your body composition. Seek guidance from your physician and exercise physiologist before implementing an exercise routine.  Exercise Action Plan Clinical staff conducted group or individual video education with verbal and written material and guidebook.  Patient learns the recommended strategies to achieve and enjoy long-term exercise adherence, including variety, self-motivation, self-efficacy, and positive decision making. Benefits of exercise include fitness, good health, weight management, more energy, better sleep, less stress, and overall well-being.  Medical   Heart Disease Risk Reduction Clinical staff conducted group or individual video education with verbal and written material and guidebook.  Patient learns our heart is our most vital organ as it circulates oxygen, nutrients, white blood cells, and hormones throughout the entire body, and carries waste away. Data supports a plant-based eating plan like the Pritikin Program for its effectiveness in slowing progression of and reversing heart disease. The video provides a number of recommendations to address heart disease.   Metabolic Syndrome and Belly Fat  Clinical staff conducted group or  individual video education with verbal and written material and guidebook.  Patient learns what metabolic syndrome is, how it leads to heart disease, and how one can reverse it and keep it from coming back. You have metabolic syndrome if you have 3 of the following 5 criteria: abdominal obesity, high blood pressure, high triglycerides, low HDL cholesterol, and high blood sugar.  Hypertension and Heart Disease Clinical staff conducted group or individual video education with verbal and written material and guidebook.  Patient learns that high blood pressure, or hypertension, is very common in the [place]. Hypertension is largely due to excessive salt intake, but other important risk factors include being overweight, physical inactivity, drinking too much alcohol, smoking, and not eating enough potassium from fruits and vegetables. High blood pressure is a leading risk factor for heart attack, stroke, congestive heart failure, dementia, kidney failure, and premature death. Long-term effects of excessive salt intake include stiffening of the arteries and thickening of heart muscle and organ damage. Recommendations include ways to reduce hypertension and the risk of heart disease.  Diseases of Our Time - Focusing on Diabetes Clinical staff conducted group or individual video education with verbal and written material and guidebook.  Patient learns why the best way to stop diseases of our time is prevention, through food and other lifestyle changes. Medicine (such as prescription pills and surgeries) is often only a Band-Aid on the problem, not a  long-term solution. Most common diseases of our time include obesity, type 2 diabetes, hypertension, heart disease, and cancer. The Pritikin Program is recommended and has been proven to help reduce, reverse, and/or prevent the damaging effects of metabolic syndrome.  Nutrition   Overview of the Pritikin Eating Plan  Clinical staff conducted group or  individual video education with verbal and written material and guidebook.  Patient learns about the Pritikin Eating Plan for disease risk reduction. The Pritikin Eating Plan emphasizes a wide variety of unrefined, minimally-processed carbohydrates, like fruits, vegetables, whole grains, and legumes. Go, Caution, and Stop food choices are explained. Plant-based and lean animal proteins are emphasized. Rationale provided for low sodium intake for blood pressure control, low added sugars for blood sugar stabilization, and low added fats and oils for coronary artery disease risk reduction and weight management.  Calorie Density  Clinical staff conducted group or individual video education with verbal and written material and guidebook.  Patient learns about calorie density and how it impacts the Pritikin Eating Plan. Knowing the characteristics of the food you choose will help you decide whether those foods will lead to weight gain or weight loss, and whether you want to consume more or less of them. Weight loss is usually a side effect of the Pritikin Eating Plan because of its focus on low calorie-dense foods.  Label Reading  Clinical staff conducted group or individual video education with verbal and written material and guidebook.  Patient learns about the Pritikin recommended label reading guidelines and corresponding recommendations regarding calorie density, added sugars, sodium content, and whole grains.  Dining Out - Part 1  Clinical staff conducted group or individual video education with verbal and written material and guidebook.  Patient learns that restaurant meals can be sabotaging because they can be so high in calories, fat, sodium, and/or sugar. Patient learns recommended strategies on how to positively address this and avoid unhealthy pitfalls.  Facts on Fats  Clinical staff conducted group or individual video education with verbal and written material and guidebook.  Patient learns  that lifestyle modifications can be just as effective, if not more so, as many medications for lowering your risk of heart disease. A Pritikin lifestyle can help to reduce your risk of inflammation and atherosclerosis (cholesterol build-up, or plaque, in the artery walls). Lifestyle interventions such as dietary choices and physical activity address the cause of atherosclerosis. A review of the types of fats and their impact on blood cholesterol levels, along with dietary recommendations to reduce fat intake is also included.  Nutrition Action Plan  Clinical staff conducted group or individual video education with verbal and written material and guidebook.  Patient learns how to incorporate Pritikin recommendations into their lifestyle. Recommendations include planning and keeping personal health goals in mind as an important part of their success.  Healthy Mind-Set    Healthy Minds, Bodies, Hearts  Clinical staff conducted group or individual video education with verbal and written material and guidebook.  Patient learns how to identify when they are stressed. Video will discuss the impact of that stress, as well as the many benefits of stress management. Patient will also be introduced to stress management techniques. The way we think, act, and feel has an impact on our hearts.  How Our Thoughts Can Heal Our Hearts  Clinical staff conducted group or individual video education with verbal and written material and guidebook.  Patient learns that negative thoughts can cause depression and anxiety. This can result in negative lifestyle  behavior and serious health problems. Cognitive behavioral therapy is an effective method to help control our thoughts in order to change and improve our emotional outlook.  Additional Videos:  Exercise    Improving Performance  Clinical staff conducted group or individual video education with verbal and written material and guidebook.  Patient learns to use a  non-linear approach by alternating intensity levels and lengths of time spent exercising to help burn more calories and lose more body fat. Cardiovascular exercise helps improve heart health, metabolism, hormonal balance, blood sugar control, and recovery from fatigue. Resistance training improves strength, endurance, balance, coordination, reaction time, metabolism, and muscle mass. Flexibility exercise improves circulation, posture, and balance. Seek guidance from your physician and exercise physiologist before implementing an exercise routine and learn your capabilities and proper form for all exercise.  Introduction to Yoga  Clinical staff conducted group or individual video education with verbal and written material and guidebook.  Patient learns about yoga, a discipline of the coming together of mind, breath, and body. The benefits of yoga include improved flexibility, improved range of motion, better posture and core strength, increased lung function, weight loss, and positive self-image. Yoga's heart health benefits include lowered blood pressure, healthier heart rate, decreased cholesterol and triglyceride levels, improved immune function, and reduced stress. Seek guidance from your physician and exercise physiologist before implementing an exercise routine and learn your capabilities and proper form for all exercise.  Medical   Aging: Enhancing Your Quality of Life  Clinical staff conducted group or individual video education with verbal and written material and guidebook.  Patient learns key strategies and recommendations to stay in good physical health and enhance quality of life, such as prevention strategies, having an advocate, securing a Health Care Proxy and Power of Attorney, and keeping a list of medications and system for tracking them. It also discusses how to avoid risk for bone loss.  Biology of Weight Control  Clinical staff conducted group or individual video education with  verbal and written material and guidebook.  Patient learns that weight gain occurs because we consume more calories than we burn (eating more, moving less). Even if your body weight is normal, you may have higher ratios of fat compared to muscle mass. Too much body fat puts you at increased risk for cardiovascular disease, heart attack, stroke, type 2 diabetes, and obesity-related cancers. In addition to exercise, following the Pritikin Eating Plan can help reduce your risk.  Decoding Lab Results  Clinical staff conducted group or individual video education with verbal and written material and guidebook.  Patient learns that lab test reflects one measurement whose values change over time and are influenced by many factors, including medication, stress, sleep, exercise, food, hydration, pre-existing medical conditions, and more. It is recommended to use the knowledge from this video to become more involved with your lab results and evaluate your numbers to speak with your doctor.   Diseases of Our Time - Overview  Clinical staff conducted group or individual video education with verbal and written material and guidebook.  Patient learns that according to the CDC, 50% to 70% of chronic diseases (such as obesity, type 2 diabetes, elevated lipids, hypertension, and heart disease) are avoidable through lifestyle improvements including healthier food choices, listening to satiety cues, and increased physical activity.  Sleep Disorders Clinical staff conducted group or individual video education with verbal and written material and guidebook.  Patient learns how good quality and duration of sleep are important to overall health and  well-being. Patient also learns about sleep disorders and how they impact health along with recommendations to address them, including discussing with a physician.  Nutrition  Dining Out - Part 2 Clinical staff conducted group or individual video education with verbal and  written material and guidebook.  Patient learns how to plan ahead and communicate in order to maximize their dining experience in a healthy and nutritious manner. Included are recommended food choices based on the type of restaurant the patient is visiting.   Fueling a Banker conducted group or individual video education with verbal and written material and guidebook.  There is a strong connection between our food choices and our health. Diseases like obesity and type 2 diabetes are very prevalent and are in large-part due to lifestyle choices. The Pritikin Eating Plan provides plenty of food and hunger-curbing satisfaction. It is easy to follow, affordable, and helps reduce health risks.  Menu Workshop  Clinical staff conducted group or individual video education with verbal and written material and guidebook.  Patient learns that restaurant meals can sabotage health goals because they are often packed with calories, fat, sodium, and sugar. Recommendations include strategies to plan ahead and to communicate with the manager, chef, or server to help order a healthier meal.  Planning Your Eating Strategy  Clinical staff conducted group or individual video education with verbal and written material and guidebook.  Patient learns about the Pritikin Eating Plan and its benefit of reducing the risk of disease. The Pritikin Eating Plan does not focus on calories. Instead, it emphasizes high-quality, nutrient-rich foods. By knowing the characteristics of the foods, we choose, we can determine their calorie density and make informed decisions.  Targeting Your Nutrition Priorities  Clinical staff conducted group or individual video education with verbal and written material and guidebook.  Patient learns that lifestyle habits have a tremendous impact on disease risk and progression. This video provides eating and physical activity recommendations based on your personal health goals,  such as reducing LDL cholesterol, losing weight, preventing or controlling type 2 diabetes, and reducing high blood pressure.  Vitamins and Minerals  Clinical staff conducted group or individual video education with verbal and written material and guidebook.  Patient learns different ways to obtain key vitamins and minerals, including through a recommended healthy diet. It is important to discuss all supplements you take with your doctor.   Healthy Mind-Set    Smoking Cessation  Clinical staff conducted group or individual video education with verbal and written material and guidebook.  Patient learns that cigarette smoking and tobacco addiction pose a serious health risk which affects millions of people. Stopping smoking will significantly reduce the risk of heart disease, lung disease, and many forms of cancer. Recommended strategies for quitting are covered, including working with your doctor to develop a successful plan.  Culinary   Becoming a Set designer conducted group or individual video education with verbal and written material and guidebook.  Patient learns that cooking at home can be healthy, cost-effective, quick, and puts them in control. Keys to cooking healthy recipes will include looking at your recipe, assessing your equipment needs, planning ahead, making it simple, choosing cost-effective seasonal ingredients, and limiting the use of added fats, salts, and sugars.  Cooking - Breakfast and Snacks  Clinical staff conducted group or individual video education with verbal and written material and guidebook.  Patient learns how important breakfast is to satiety and nutrition through the entire day. Recommendations  include key foods to eat during breakfast to help stabilize blood sugar levels and to prevent overeating at meals later in the day. Planning ahead is also a key component.  Cooking - Educational psychologist conducted group or individual video  education with verbal and written material and guidebook.  Patient learns eating strategies to improve overall health, including an approach to cook more at home. Recommendations include thinking of animal protein as a side on your plate rather than center stage and focusing instead on lower calorie dense options like vegetables, fruits, whole grains, and plant-based proteins, such as beans. Making sauces in large quantities to freeze for later and leaving the skin on your vegetables are also recommended to maximize your experience.  Cooking - Healthy Salads and Dressing Clinical staff conducted group or individual video education with verbal and written material and guidebook.  Patient learns that vegetables, fruits, whole grains, and legumes are the foundations of the Pritikin Eating Plan. Recommendations include how to incorporate each of these in flavorful and healthy salads, and how to create homemade salad dressings. Proper handling of ingredients is also covered. Cooking - Soups and [employer] - Soups and Desserts Clinical staff conducted group or individual video education with verbal and written material and guidebook.  Patient learns that Pritikin soups and desserts make for easy, nutritious, and delicious snacks and meal components that are low in sodium, fat, sugar, and calorie density, while high in vitamins, minerals, and filling fiber. Recommendations include simple and healthy ideas for soups and desserts.   Overview     The Pritikin Solution Program Overview Clinical staff conducted group or individual video education with verbal and written material and guidebook.  Patient learns that the results of the Pritikin Program have been documented in more than 100 articles published in peer-reviewed journals, and the benefits include reducing risk factors for (and, in some cases, even reversing) high cholesterol, high blood pressure, type 2 diabetes, obesity, and more! An overview of  the three key pillars of the Pritikin Program will be covered: eating well, doing regular exercise, and having a healthy mind-set.  WORKSHOPS  Exercise: Exercise Basics: Building Your Action Plan Clinical staff led group instruction and group discussion with PowerPoint presentation and patient guidebook. To enhance the learning environment the use of posters, models and videos may be added. At the conclusion of this workshop, patients will comprehend the difference between physical activity and exercise, as well as the benefits of incorporating both, into their routine. Patients will understand the FITT (Frequency, Intensity, Time, and Type) principle and how to use it to build an exercise action plan. In addition, safety concerns and other considerations for exercise and cardiac rehab will be addressed by the presenter. The purpose of this lesson is to promote a comprehensive and effective weekly exercise routine in order to improve patients' overall level of fitness.   Managing Heart Disease: Your Path to a Healthier Heart Clinical staff led group instruction and group discussion with PowerPoint presentation and patient guidebook. To enhance the learning environment the use of posters, models and videos may be added.At the conclusion of this workshop, patients will understand the anatomy and physiology of the heart. Additionally, they will understand how Pritikin's three pillars impact the risk factors, the progression, and the management of heart disease.  The purpose of this lesson is to provide a high-level overview of the heart, heart disease, and how the Pritikin lifestyle positively impacts risk factors.  Exercise Biomechanics  Clinical staff led group instruction and group discussion with PowerPoint presentation and patient guidebook. To enhance the learning environment the use of posters, models and videos may be added. Patients will learn how the structural parts of their bodies  function and how these functions impact their daily activities, movement, and exercise. Patients will learn how to promote a neutral spine, learn how to manage pain, and identify ways to improve their physical movement in order to promote healthy living. The purpose of this lesson is to expose patients to common physical limitations that impact physical activity. Participants will learn practical ways to adapt and manage aches and pains, and to minimize their effect on regular exercise. Patients will learn how to maintain good posture while sitting, walking, and lifting.  Balance Training and Fall Prevention  Clinical staff led group instruction and group discussion with PowerPoint presentation and patient guidebook. To enhance the learning environment the use of posters, models and videos may be added. At the conclusion of this workshop, patients will understand the importance of their sensorimotor skills (vision, proprioception, and the vestibular system) in maintaining their ability to balance as they age. Patients will apply a variety of balancing exercises that are appropriate for their current level of function. Patients will understand the common causes for poor balance, possible solutions to these problems, and ways to modify their physical environment in order to minimize their fall risk. The purpose of this lesson is to teach patients about the importance of maintaining balance as they age and ways to minimize their risk of falling.  WORKSHOPS   Nutrition:  Fueling a Ship broker led group instruction and group discussion with PowerPoint presentation and patient guidebook. To enhance the learning environment the use of posters, models and videos may be added. Patients will review the foundational principles of the Pritikin Eating Plan and understand what constitutes a serving size in each of the food groups. Patients will also learn Pritikin-friendly foods that are better  choices when away from home and review make-ahead meal and snack options. Calorie density will be reviewed and applied to three nutrition priorities: weight maintenance, weight loss, and weight gain. The purpose of this lesson is to reinforce (in a group setting) the key concepts around what patients are recommended to eat and how to apply these guidelines when away from home by planning and selecting Pritikin-friendly options. Patients will understand how calorie density may be adjusted for different weight management goals.  Mindful Eating  Clinical staff led group instruction and group discussion with PowerPoint presentation and patient guidebook. To enhance the learning environment the use of posters, models and videos may be added. Patients will briefly review the concepts of the Pritikin Eating Plan and the importance of low-calorie dense foods. The concept of mindful eating will be introduced as well as the importance of paying attention to internal hunger signals. Triggers for non-hunger eating and techniques for dealing with triggers will be explored. The purpose of this lesson is to provide patients with the opportunity to review the basic principles of the Pritikin Eating Plan, discuss the value of eating mindfully and how to measure internal cues of hunger and fullness using the Hunger Scale. Patients will also discuss reasons for non-hunger eating and learn strategies to use for controlling emotional eating.  Targeting Your Nutrition Priorities Clinical staff led group instruction and group discussion with PowerPoint presentation and patient guidebook. To enhance the learning environment the use of posters, models and videos may be added. Patients  will learn how to determine their genetic susceptibility to disease by reviewing their family history. Patients will gain insight into the importance of diet as part of an overall healthy lifestyle in mitigating the impact of genetics and other  environmental insults. The purpose of this lesson is to provide patients with the opportunity to assess their personal nutrition priorities by looking at their family history, their own health history and current risk factors. Patients will also be able to discuss ways of prioritizing and modifying the Pritikin Eating Plan for their highest risk areas  Menu  Clinical staff led group instruction and group discussion with PowerPoint presentation and patient guidebook. To enhance the learning environment the use of posters, models and videos may be added. Using menus brought in from [employer], or printed from [employer], patients will apply the Pritikin dining out guidelines that were presented in the [employer] video. Patients will also be able to practice these guidelines in a variety of provided scenarios. The purpose of this lesson is to provide patients with the opportunity to practice hands-on learning of the Pritikin Dining Out guidelines with actual menus and practice scenarios.  Label Reading Clinical staff led group instruction and group discussion with PowerPoint presentation and patient guidebook. To enhance the learning environment the use of posters, models and videos may be added. Patients will review and discuss the Pritikin label reading guidelines presented in Pritikin's Label Reading Educational series video. Using fool labels brought in from local grocery stores and markets, patients will apply the label reading guidelines and determine if the packaged food meet the Pritikin guidelines. The purpose of this lesson is to provide patients with the opportunity to review, discuss, and practice hands-on learning of the Pritikin Label Reading guidelines with actual packaged food labels. Cooking School  Pritikin's [employer] are designed to teach patients ways to prepare quick, simple, and affordable recipes at home. The importance of nutrition's role in  chronic disease risk reduction is reflected in its emphasis in the overall Pritikin program. By learning how to prepare essential core Pritikin Eating Plan recipes, patients will increase control over what they eat; be able to customize the flavor of foods without the use of added salt, sugar, or fat; and improve the quality of the food they consume. By learning a set of core recipes which are easily assembled, quickly prepared, and affordable, patients are more likely to prepare more healthy foods at home. These workshops focus on convenient breakfasts, simple entres, side dishes, and desserts which can be prepared with minimal effort and are consistent with nutrition recommendations for cardiovascular risk reduction. Cooking [employer] are taught by a Armed forces logistics/support/administrative officer (RD) who has been trained by the [employer]. The chef or RD has a clear understanding of the importance of minimizing - if not completely eliminating - added fat, sugar, and sodium in recipes. Throughout the series of Cooking School Workshop sessions, patients will learn about healthy ingredients and efficient methods of cooking to build confidence in their capability to prepare    Cooking School weekly topics:  Adding Flavor- Sodium-Free  Fast and Healthy Breakfasts  Powerhouse Plant-Based Proteins  Satisfying Salads and Dressings  Simple Sides and Sauces  International Cuisine-Spotlight on the [employer] Zones  Delicious Desserts  Savory Soups  [employer] - Meals in a Astronomer Appetizers and Snacks  Comforting Weekend Breakfasts  One-Pot Wonders   Fast Evening Meals  Landscape architect Your  Pritikin Plate  WORKSHOPS   Healthy Mindset (Psychosocial):  Focused Goals, Sustainable Changes Clinical staff led group instruction and group discussion with PowerPoint presentation and patient guidebook. To enhance the learning environment the use of posters, models and videos may  be added. Patients will be able to apply effective goal setting strategies to establish at least one personal goal, and then take consistent, meaningful action toward that goal. They will learn to identify common barriers to achieving personal goals and develop strategies to overcome them. Patients will also gain an understanding of how our mind-set can impact our ability to achieve goals and the importance of cultivating a positive and growth-oriented mind-set. The purpose of this lesson is to provide patients with a deeper understanding of how to set and achieve personal goals, as well as the tools and strategies needed to overcome common obstacles which may arise along the way.  From Head to Heart: The Power of a Healthy Outlook  Clinical staff led group instruction and group discussion with PowerPoint presentation and patient guidebook. To enhance the learning environment the use of posters, models and videos may be added. Patients will be able to recognize and describe the impact of emotions and mood on physical health. They will discover the importance of self-care and explore self-care practices which may work for them. Patients will also learn how to utilize the 4 C's to cultivate a healthier outlook and better manage stress and challenges. The purpose of this lesson is to demonstrate to patients how a healthy outlook is an essential part of maintaining good health, especially as they continue their cardiac rehab journey.  Healthy Sleep for a Healthy Heart Clinical staff led group instruction and group discussion with PowerPoint presentation and patient guidebook. To enhance the learning environment the use of posters, models and videos may be added. At the conclusion of this workshop, patients will be able to demonstrate knowledge of the importance of sleep to overall health, well-being, and quality of life. They will understand the symptoms of, and treatments for, common sleep disorders. Patients  will also be able to identify daytime and nighttime behaviors which impact sleep, and they will be able to apply these tools to help manage sleep-related challenges. The purpose of this lesson is to provide patients with a general overview of sleep and outline the importance of quality sleep. Patients will learn about a few of the most common sleep disorders. Patients will also be introduced to the concept of "sleep hygiene," and discover ways to self-manage certain sleeping problems through simple daily behavior changes. Finally, the workshop will motivate patients by clarifying the links between quality sleep and their goals of heart-healthy living.   Recognizing and Reducing Stress Clinical staff led group instruction and group discussion with PowerPoint presentation and patient guidebook. To enhance the learning environment the use of posters, models and videos may be added. At the conclusion of this workshop, patients will be able to understand the types of stress reactions, differentiate between acute and chronic stress, and recognize the impact that chronic stress has on their health. They will also be able to apply different coping mechanisms, such as reframing negative self-talk. Patients will have the opportunity to practice a variety of stress management techniques, such as deep abdominal breathing, progressive muscle relaxation, and/or guided imagery.  The purpose of this lesson is to educate patients on the role of stress in their lives and to provide healthy techniques for coping with it.  Learning Barriers/Preferences:  Learning Barriers/Preferences -  03/14/23 1410       Learning Barriers/Preferences   Learning Barriers Exercise Concerns   Poor balance   Learning Preferences Skilled Demonstration;Individual Instruction;Group Instruction;Pictoral;Written Material             Education Topics:  Knowledge Questionnaire Score:  Knowledge Questionnaire Score - 03/14/23 1411        Knowledge Questionnaire Score   Pre Score 20/24             Core Components/Risk Factors/Patient Goals at Admission:  Personal Goals and Risk Factors at Admission - 03/14/23 1413       Core Components/Risk Factors/Patient Goals on Admission   Hypertension Yes    Intervention Provide education on lifestyle modifcations including regular physical activity/exercise, weight management, moderate sodium restriction and increased consumption of fresh fruit, vegetables, and low fat dairy, alcohol moderation, and smoking cessation.;Monitor prescription use compliance.    Expected Outcomes Short Term: Continued assessment and intervention until BP is < 140/29mm HG in hypertensive participants. < 130/46mm HG in hypertensive participants with diabetes, heart failure or chronic kidney disease.;Long Term: Maintenance of blood pressure at goal levels.    Lipids Yes    Intervention Provide education and support for participant on nutrition & aerobic/resistive exercise along with prescribed medications to achieve LDL 70mg , HDL >40mg .    Expected Outcomes Short Term: Participant states understanding of desired cholesterol values and is compliant with medications prescribed. Participant is following exercise prescription and nutrition guidelines.;Long Term: Cholesterol controlled with medications as prescribed, with individualized exercise RX and with personalized nutrition plan. Value goals: LDL < 70mg , HDL > 40 mg.    Stress Yes    Intervention Offer individual and/or small group education and counseling on adjustment to heart disease, stress management and health-related lifestyle change. Teach and support self-help strategies.;Refer participants experiencing significant psychosocial distress to appropriate mental health specialists for further evaluation and treatment. When possible, include family members and significant others in education/counseling sessions.    Expected Outcomes Short Term: Participant  demonstrates changes in health-related behavior, relaxation and other stress management skills, ability to obtain effective social support, and compliance with psychotropic medications if prescribed.;Long Term: Emotional wellbeing is indicated by absence of clinically significant psychosocial distress or social isolation.    Personal Goal Other Yes    Personal Goal Short: control SOB Long: get back to work. Stamina    Intervention Will continue to monitor pt and progress workloads as tolerated without sign or symptom    Expected Outcomes Pt will achieve her goals             Core Components/Risk Factors/Patient Goals Review:    Core Components/Risk Factors/Patient Goals at Discharge (Final Review):    ITP Comments:  ITP Comments     Row Name 03/14/23 1122           ITP Comments Dr. Armanda Magic medical director. Introduction to [facility] education/ intensive cardiac rehab. initial orientation packet reviewed with patient.                Comments: Participant attended orientation for the cardiac rehabilitation program on  03/14/2023  to perform initial intake and exercise walk test. Patient introduced to the [facility] education and orientation packet was reviewed. Completed 6-minute walk test, measurements, initial ITP, and exercise prescription. Vital signs stable. She is on 2 liters oxygen, mild SOB on walk test. Resolved with rest. Some chronic back pain that lingers most of the time without resolving. She receives injections for this. Telemetry-normal sinus  rhythm.    Service time was from 1031 to 1250.

## 2023-03-14 NOTE — Progress Notes (Signed)
Cardiac Rehab Medication Review   Does the patient  feel that his/her medications are working for him/her?  yes  Has the patient been experiencing any side effects to the medications prescribed?  No  Does the patient measure his/her own blood pressure or blood glucose at home?  Yes, she checks her oxygen and BP at home on a daily basis   Does the patient have any problems obtaining medications due to transportation or finances?   no  Understanding of regimen: good Understanding of indications: good Potential of compliance: excellent    Comments: Pt feels good with her medication routine and is compliant with her medication routine. She is on 2 liters of oxygen most of the time and is doing well with the shoulder pack instead of the tanks   Harrie Jeans 03/14/2023 2:18 PM

## 2023-03-15 ENCOUNTER — Ambulatory Visit (HOSPITAL_COMMUNITY): Payer: Medicare Other

## 2023-03-15 ENCOUNTER — Telehealth: Payer: Self-pay | Admitting: Pulmonary Disease

## 2023-03-15 NOTE — Telephone Encounter (Signed)
Cardiac rehab is calling wanting to know if it is okay for patient to use oxygen during seasons, the saturation of the oxygen,is it it okay to titrate.

## 2023-03-18 ENCOUNTER — Ambulatory Visit (HOSPITAL_COMMUNITY): Payer: Medicare Other

## 2023-03-18 ENCOUNTER — Other Ambulatory Visit: Payer: Self-pay | Admitting: Pulmonary Disease

## 2023-03-18 NOTE — Telephone Encounter (Signed)
Yes.  Okay to use oxygen during pulmonary rehab and it may be titrated to keep saturations greater than 90%

## 2023-03-18 NOTE — Telephone Encounter (Signed)
Spoke to with [facility]. She is questioning if patient can use oxygen during pulmonary rehab and is it okay to titrate if needed?   AO, please advise. Dr. Francine Graven is unavailable.

## 2023-03-18 NOTE — Telephone Encounter (Signed)
Lm x1 for [facility].

## 2023-03-19 ENCOUNTER — Ambulatory Visit (HOSPITAL_COMMUNITY): Payer: Medicare Other

## 2023-03-19 NOTE — Progress Notes (Signed)
Cardiac Individual Treatment Plan  Patient Details  Name: Victoria Fitzgerald MRN: [id number] Date of Birth: [birth year] Referring Provider:   Flowsheet Row INTENSIVE CARDIAC REHAB ORIENT from 03/14/2023 in [facility]  Referring Provider Dr Wille Glaser, MD (Dr. 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  Patient's Home Medications on Admission:  Current Outpatient Medications:    acetaminophen (TYLENOL) 500 MG tablet, Take 500 mg by mouth every 4 (four) hours as needed for mild pain or moderate pain., Disp: , Rfl:    amLODipine (NORVASC) 10 MG tablet, Take 0.5 tablets by mouth daily., Disp: , Rfl:    aspirin 81 MG chewable tablet, Chew 81 mg by mouth daily., Disp: , Rfl:    BRILINTA 90 MG TABS tablet, Take 90 mg by mouth 2 (two) times daily., Disp: , Rfl:    Cholecalciferol 125 MCG (5000 UT) capsule, Take 1 capsule by mouth daily., Disp: , Rfl:    famotidine (PEPCID) 20 MG tablet, Take 20 mg by mouth 2 (two) times daily., Disp: , Rfl:    isosorbide mononitrate (IMDUR) 60 MG 24 hr tablet, Take 60 mg by mouth daily., Disp: , Rfl:    metoprolol tartrate (LOPRESSOR) 25 MG tablet, Take 25 mg by mouth 2 (two) times daily., Disp: , Rfl:    nitroGLYCERIN (NITROSTAT) 0.4 MG SL tablet, Place 0.4 mg under the tongue every 5 (five) minutes as needed., Disp: , Rfl:    pravastatin (PRAVACHOL) 40 MG tablet, Take 20 mg by mouth daily., Disp: , Rfl:    Tiotropium Bromide Monohydrate (SPIRIVA RESPIMAT) 2.5 MCG/ACT AERS, INHALE 2 PUFFS BY MOUTH INTO THE LUNGS DAILY, Disp: 4 g, Rfl: 11   valsartan (DIOVAN) 80 MG tablet, Take 120 mg by mouth daily., Disp: , Rfl:    VENTOLIN HFA 108 (90 Base) MCG/ACT inhaler, Inhale 1-2 puffs  into the lungs every 6 (six) hours as needed., Disp: , Rfl:   Past Medical History: Past Medical History:  Diagnosis Date   Hypertension    Pneumonia due to COVID-19 virus 2022    Tobacco Use: Social History   Tobacco Use  Smoking Status Former   Current packs/day: 0.00   Types: Cigarettes   Quit date: 05/29/2019   Years since quitting: 3.8   Passive exposure: Past  Smokeless Tobacco Never    Labs: Review Flowsheet       Latest Ref Rng & Units 05/26/2008 07/01/2008  Labs for ITP Cardiac and Pulmonary Rehab  Cholestrol 0 - 200 mg/dL - 865        ATP III CLASSIFICATION:  <200     mg/dL   Desirable  784-696  mg/dL   Borderline High  >=295    mg/dL   High          LDL (calc) 0 - 99 mg/dL - 284        Total Cholesterol/HDL:CHD Risk Coronary Heart Disease Risk Table                     Men   Women  1/2 Average Risk   3.4   3.3  Average Risk       5.0   4.4  2 X  Average Risk   9.6   7.1  3 X Average Risk  23.4   11.0        Use the calculated Patient Ratio above and the CHD Risk Table to determine the patient's CHD Risk.        ATP III CLASSIFICATION (LDL):  <100     mg/dL   Optimal  295-284  mg/dL   Near or Above                    Optimal  130-159  mg/dL   Borderline  132-440  mg/dL   High  >102     mg/dL   Very High   HDL-C >72 mg/dL - 62   Trlycerides <536 mg/dL - 58   TCO2 0 - 644 mmol/L 29  -    Details            Capillary Blood Glucose: No results found for: "GLUCAP"   Exercise Target Goals: Exercise Program Goal: Individual exercise prescription set using results from initial 6 min walk test and THRR while considering  patient's activity barriers and safety.   Exercise Prescription Goal: Initial exercise prescription builds to 30-45 minutes a day of aerobic activity, 2-3 days per week.  Home exercise guidelines will be given to patient during program as part of exercise prescription that the participant will acknowledge.  Activity Barriers &  Risk Stratification:  Activity Barriers & Cardiac Risk Stratification - 03/14/23 1400       Activity Barriers & Cardiac Risk Stratification   Activity Barriers Joint Problems;Right Hip Replacement;Left Hip Replacement;Shortness of Breath;Deconditioning;Back Problems;Balance Concerns;Neck/Spine Problems    Cardiac Risk Stratification High             6 Minute Walk:  6 Minute Walk     Row Name 03/14/23 1351         6 Minute Walk   Phase Initial     Distance 630 feet     Walk Time 6 minutes     # of Rest Breaks 0     MPH 1.93     METS 1.48     RPE 13     Perceived Dyspnea  0     VO2 Peak 5.18     Symptoms Yes (comment)     Comments back pain 2/10 chronic not resolved with rest. SOB 1DYS resolved with rest     Resting HR 65 bpm     Resting BP 122/62     Resting Oxygen Saturation  99 %     Exercise Oxygen Saturation  during 6 min walk 96 %     Max Ex. HR 76 bpm     Max Ex. BP 152/75     2 Minute Post BP 132/72       Interval Oxygen   Interval Oxygen? Yes     Baseline Oxygen Saturation % 99 %     1 Minute Oxygen Saturation % 98 %     1 Minute Liters of Oxygen 2 L     2 Minute Oxygen Saturation % 97 %     2 Minute Liters of Oxygen 2 L     3 Minute Oxygen Saturation % 96 %     3 Minute Liters of Oxygen 2 L     4 Minute Oxygen Saturation % 96 %     4 Minute Liters of Oxygen 2 L     5 Minute Oxygen Saturation % 96 %  5 Minute Liters of Oxygen 2 L     6 Minute Oxygen Saturation % 96 %     6 Minute Liters of Oxygen 2 L     2 Minute Post Oxygen Saturation % 98 %     2 Minute Post Liters of Oxygen 2 L              Oxygen Initial Assessment:   Oxygen Re-Evaluation:   Oxygen Discharge (Final Oxygen Re-Evaluation):   Initial Exercise Prescription:  Initial Exercise Prescription - 03/14/23 1400       Date of Initial Exercise RX and Referring Provider   Date 03/14/23    Referring Provider Dr Wille Glaser, MD (Dr. Armanda Magic MD covering)     Expected Discharge Date 06/06/22      Oxygen   Oxygen Continuous    Liters 2    Maintain Oxygen Saturation 88% or higher      NuStep   Level 1    SPM 60    Minutes 20    METs 1.4      Prescription Details   Frequency (times per week) 3    Duration Progress to 30 minutes of continuous aerobic without signs/symptoms of physical distress      Intensity   THRR 40-80% of Max Heartrate 56-113    Ratings of Perceived Exertion 11-13    Perceived Dyspnea 0-4      Progression   Progression Continue progressive overload as per policy without signs/symptoms or physical distress.      Resistance Training   Training Prescription Yes    Weight 1    Reps 10-15             Perform Capillary Blood Glucose checks as needed.  Exercise Prescription Changes:   Exercise Comments:   Exercise Goals and Review:   Exercise Goals     Row Name 03/14/23 1122             Exercise Goals   Increase Physical Activity Yes       Intervention Provide advice, education, support and counseling about physical activity/exercise needs.;Develop an individualized exercise prescription for aerobic and resistive training based on initial evaluation findings, risk stratification, comorbidities and participant's personal goals.       Expected Outcomes Short Term: Attend rehab on a regular basis to increase amount of physical activity.;Long Term: Exercising regularly at least 3-5 days a week.;Long Term: Add in home exercise to make exercise part of routine and to increase amount of physical activity.       Increase Strength and Stamina Yes       Intervention Provide advice, education, support and counseling about physical activity/exercise needs.;Develop an individualized exercise prescription for aerobic and resistive training based on initial evaluation findings, risk stratification, comorbidities and participant's personal goals.       Expected Outcomes Short Term: Increase workloads from initial  exercise prescription for resistance, speed, and METs.;Short Term: Perform resistance training exercises routinely during rehab and add in resistance training at home;Long Term: Improve cardiorespiratory fitness, muscular endurance and strength as measured by increased METs and functional capacity ( )       Able to understand and use rate of perceived exertion (RPE) scale Yes       Intervention Provide education and explanation on how to use RPE scale       Expected Outcomes Short Term: Able to use RPE daily in rehab to express subjective intensity level;Long Term:  Able to use RPE to  guide intensity level when exercising independently       Able to understand and use Dyspnea scale Yes       Intervention Provide education and explanation on how to use Dyspnea scale       Expected Outcomes Short Term: Able to use Dyspnea scale daily in rehab to express subjective sense of shortness of breath during exertion;Long Term: Able to use Dyspnea scale to guide intensity level when exercising independently       Knowledge and understanding of Target Heart Rate Range (THRR) Yes       Intervention Provide education and explanation of THRR including how the numbers were predicted and where they are located for reference       Expected Outcomes Short Term: Able to use daily as guideline for intensity in rehab;Long Term: Able to use THRR to govern intensity when exercising independently;Short Term: Able to state/look up THRR       Understanding of Exercise Prescription Yes       Intervention Provide education, explanation, and written materials on patient's individual exercise prescription       Expected Outcomes Long Term: Able to explain home exercise prescription to exercise independently;Short Term: Able to explain program exercise prescription                Exercise Goals Re-Evaluation :   Discharge Exercise Prescription (Final Exercise Prescription Changes):   Nutrition:  Target Goals:  Understanding of nutrition guidelines, daily intake of sodium 1500mg , cholesterol 200mg , calories 30% from fat and 7% or less from saturated fats, daily to have 5 or more servings of fruits and vegetables.  Biometrics:  Pre Biometrics - 03/14/23 1347       Pre Biometrics   Waist Circumference 32.5 inches    Hip Circumference 34 inches    Waist to Hip Ratio 0.96 %    Triceps Skinfold 10 mm    % Body Fat 29.2 %    Grip Strength 22 kg    Flexibility --   Did not attempt, chronic back pain   Single Leg Stand 16 seconds              Nutrition Therapy Plan and Nutrition Goals:   Nutrition Assessments:  MEDIFICTS Score Key: >=70 Need to make dietary changes  40-70 Heart Healthy Diet <= 40 Therapeutic Level Cholesterol Diet    Picture Your Plate Scores: <44 Unhealthy dietary pattern with much room for improvement. 41-50 Dietary pattern unlikely to meet recommendations for good health and room for improvement. 51-60 More healthful dietary pattern, with some room for improvement.  >60 Healthy dietary pattern, although there may be some specific behaviors that could be improved.    Nutrition Goals Re-Evaluation:   Nutrition Goals Re-Evaluation:   Nutrition Goals Discharge (Final Nutrition Goals Re-Evaluation):   Psychosocial: Target Goals: Acknowledge presence or absence of significant depression and/or stress, maximize coping skills, provide positive support system. Participant is able to verbalize types and ability to use techniques and skills needed for reducing stress and depression.  Initial Review & Psychosocial Screening:  Initial Psych Review & Screening - 03/14/23 1405       Initial Review   Current issues with Current Anxiety/Panic;Current Sleep Concerns;Current Stress Concerns    Source of Stress Concerns Family;Financial;Occupation;Retirement/disability;Chronic Illness    Comments She does endorse some stress and anxiety about her SOB and becomes  obsessive with checking her oxygen, but overall she feels she's coping ok for now.      Family  Dynamics   Good Support System? Yes   Son and daughter in law are good support. She is able to maintain living alone for now though     Barriers   Psychosocial barriers to participate in program The patient should benefit from training in stress management and relaxation.;Psychosocial barriers identified (see note)   She does not want assistance at this time. She does know she can reach out at any time if she changes her mind     Screening Interventions   Interventions Encouraged to exercise;To provide support and resources with identified psychosocial needs;Provide feedback about the scores to participant    Expected Outcomes Long Term Goal: Stressors or current issues are controlled or eliminated.;Short Term goal: Identification and review with participant of any Quality of Life or Depression concerns found by scoring the questionnaire.;Long Term goal: The participant improves quality of Life and PHQ9 Scores as seen by post scores and/or verbalization of changes             Quality of Life Scores:  Quality of Life - 03/14/23 1409       Quality of Life   Select Quality of Life      Quality of Life Scores   Health/Function Pre 17 %    Socioeconomic Pre 21.33 %    Psych/Spiritual Pre 29.14 %    Family Pre 28.5 %    GLOBAL Pre 22.23 %            Scores of 19 and below usually indicate a poorer quality of life in these areas.  A difference of  2-3 points is a clinically meaningful difference.  A difference of 2-3 points in the total score of the Quality of Life Index has been associated with significant improvement in overall quality of life, self-image, physical symptoms, and general health in studies assessing change in quality of life.  PHQ-9: Review Flowsheet       03/14/2023  Depression screen PHQ 2/9  Decreased Interest 1  Down, Depressed, Hopeless 0  PHQ - 2 Score 1   Altered sleeping 1  Tired, decreased energy 2  Change in appetite 0  Feeling bad or failure about yourself  0  Trouble concentrating 2  Moving slowly or fidgety/restless 0  Suicidal thoughts 0  PHQ-9 Score 6  Difficult doing work/chores --    Details           Interpretation of Total Score  Total Score Depression Severity:  1-4 = Minimal depression, 5-9 = Mild depression, 10-14 = Moderate depression, 15-19 = Moderately severe depression, 20-27 = Severe depression   Psychosocial Evaluation and Intervention:   Psychosocial Re-Evaluation:   Psychosocial Discharge (Final Psychosocial Re-Evaluation):   Vocational Rehabilitation: Provide vocational rehab assistance to qualifying candidates.   Vocational Rehab Evaluation & Intervention:  Vocational Rehab - 03/14/23 1412       Initial Vocational Rehab Evaluation & Intervention   Assessment shows need for Vocational Rehabilitation No   She wants to return to work at the [employer], but wants to see if she can increase strength and stamina before returning. No issues in obtaining a job            Education: Education Goals: Education classes will be provided on a weekly basis, covering required topics. Participant will state understanding/return demonstration of topics presented.     Core Videos: Exercise    Move It!  Clinical staff conducted group or individual video education with verbal and written material and guidebook.  Patient learns the recommended Pritikin exercise program. Exercise with the goal of living a long, healthy life. Some of the health benefits of exercise include controlled diabetes, healthier blood pressure levels, improved cholesterol levels, improved heart and lung capacity, improved sleep, and better body composition. Everyone should speak with their doctor before starting or changing an exercise routine.  Biomechanical Limitations Clinical staff conducted group or individual video education  with verbal and written material and guidebook.  Patient learns how biomechanical limitations can impact exercise and how we can mitigate and possibly overcome limitations to have an impactful and balanced exercise routine.  Body Composition Clinical staff conducted group or individual video education with verbal and written material and guidebook.  Patient learns that body composition (ratio of muscle mass to fat mass) is a key component to assessing overall fitness, rather than body weight alone. Increased fat mass, especially visceral belly fat, can put [place] at increased risk for metabolic syndrome, type 2 diabetes, heart disease, and even death. It is recommended to combine diet and exercise (cardiovascular and resistance training) to improve your body composition. Seek guidance from your physician and exercise physiologist before implementing an exercise routine.  Exercise Action Plan Clinical staff conducted group or individual video education with verbal and written material and guidebook.  Patient learns the recommended strategies to achieve and enjoy long-term exercise adherence, including variety, self-motivation, self-efficacy, and positive decision making. Benefits of exercise include fitness, good health, weight management, more energy, better sleep, less stress, and overall well-being.  Medical   Heart Disease Risk Reduction Clinical staff conducted group or individual video education with verbal and written material and guidebook.  Patient learns our heart is our most vital organ as it circulates oxygen, nutrients, white blood cells, and hormones throughout the entire body, and carries waste away. Data supports a plant-based eating plan like the Pritikin Program for its effectiveness in slowing progression of and reversing heart disease. The video provides a number of recommendations to address heart disease.   Metabolic Syndrome and Belly Fat  Clinical staff conducted group or  individual video education with verbal and written material and guidebook.  Patient learns what metabolic syndrome is, how it leads to heart disease, and how one can reverse it and keep it from coming back. You have metabolic syndrome if you have 3 of the following 5 criteria: abdominal obesity, high blood pressure, high triglycerides, low HDL cholesterol, and high blood sugar.  Hypertension and Heart Disease Clinical staff conducted group or individual video education with verbal and written material and guidebook.  Patient learns that high blood pressure, or hypertension, is very common in the [place]. Hypertension is largely due to excessive salt intake, but other important risk factors include being overweight, physical inactivity, drinking too much alcohol, smoking, and not eating enough potassium from fruits and vegetables. High blood pressure is a leading risk factor for heart attack, stroke, congestive heart failure, dementia, kidney failure, and premature death. Long-term effects of excessive salt intake include stiffening of the arteries and thickening of heart muscle and organ damage. Recommendations include ways to reduce hypertension and the risk of heart disease.  Diseases of Our Time - Focusing on Diabetes Clinical staff conducted group or individual video education with verbal and written material and guidebook.  Patient learns why the best way to stop diseases of our time is prevention, through food and other lifestyle changes. Medicine (such as prescription pills and surgeries) is often only a Band-Aid on the problem, not a  long-term solution. Most common diseases of our time include obesity, type 2 diabetes, hypertension, heart disease, and cancer. The Pritikin Program is recommended and has been proven to help reduce, reverse, and/or prevent the damaging effects of metabolic syndrome.  Nutrition   Overview of the Pritikin Eating Plan  Clinical staff conducted group or  individual video education with verbal and written material and guidebook.  Patient learns about the Pritikin Eating Plan for disease risk reduction. The Pritikin Eating Plan emphasizes a wide variety of unrefined, minimally-processed carbohydrates, like fruits, vegetables, whole grains, and legumes. Go, Caution, and Stop food choices are explained. Plant-based and lean animal proteins are emphasized. Rationale provided for low sodium intake for blood pressure control, low added sugars for blood sugar stabilization, and low added fats and oils for coronary artery disease risk reduction and weight management.  Calorie Density  Clinical staff conducted group or individual video education with verbal and written material and guidebook.  Patient learns about calorie density and how it impacts the Pritikin Eating Plan. Knowing the characteristics of the food you choose will help you decide whether those foods will lead to weight gain or weight loss, and whether you want to consume more or less of them. Weight loss is usually a side effect of the Pritikin Eating Plan because of its focus on low calorie-dense foods.  Label Reading  Clinical staff conducted group or individual video education with verbal and written material and guidebook.  Patient learns about the Pritikin recommended label reading guidelines and corresponding recommendations regarding calorie density, added sugars, sodium content, and whole grains.  Dining Out - Part 1  Clinical staff conducted group or individual video education with verbal and written material and guidebook.  Patient learns that restaurant meals can be sabotaging because they can be so high in calories, fat, sodium, and/or sugar. Patient learns recommended strategies on how to positively address this and avoid unhealthy pitfalls.  Facts on Fats  Clinical staff conducted group or individual video education with verbal and written material and guidebook.  Patient learns  that lifestyle modifications can be just as effective, if not more so, as many medications for lowering your risk of heart disease. A Pritikin lifestyle can help to reduce your risk of inflammation and atherosclerosis (cholesterol build-up, or plaque, in the artery walls). Lifestyle interventions such as dietary choices and physical activity address the cause of atherosclerosis. A review of the types of fats and their impact on blood cholesterol levels, along with dietary recommendations to reduce fat intake is also included.  Nutrition Action Plan  Clinical staff conducted group or individual video education with verbal and written material and guidebook.  Patient learns how to incorporate Pritikin recommendations into their lifestyle. Recommendations include planning and keeping personal health goals in mind as an important part of their success.  Healthy Mind-Set    Healthy Minds, Bodies, Hearts  Clinical staff conducted group or individual video education with verbal and written material and guidebook.  Patient learns how to identify when they are stressed. Video will discuss the impact of that stress, as well as the many benefits of stress management. Patient will also be introduced to stress management techniques. The way we think, act, and feel has an impact on our hearts.  How Our Thoughts Can Heal Our Hearts  Clinical staff conducted group or individual video education with verbal and written material and guidebook.  Patient learns that negative thoughts can cause depression and anxiety. This can result in negative lifestyle  behavior and serious health problems. Cognitive behavioral therapy is an effective method to help control our thoughts in order to change and improve our emotional outlook.  Additional Videos:  Exercise    Improving Performance  Clinical staff conducted group or individual video education with verbal and written material and guidebook.  Patient learns to use a  non-linear approach by alternating intensity levels and lengths of time spent exercising to help burn more calories and lose more body fat. Cardiovascular exercise helps improve heart health, metabolism, hormonal balance, blood sugar control, and recovery from fatigue. Resistance training improves strength, endurance, balance, coordination, reaction time, metabolism, and muscle mass. Flexibility exercise improves circulation, posture, and balance. Seek guidance from your physician and exercise physiologist before implementing an exercise routine and learn your capabilities and proper form for all exercise.  Introduction to Yoga  Clinical staff conducted group or individual video education with verbal and written material and guidebook.  Patient learns about yoga, a discipline of the coming together of mind, breath, and body. The benefits of yoga include improved flexibility, improved range of motion, better posture and core strength, increased lung function, weight loss, and positive self-image. Yoga's heart health benefits include lowered blood pressure, healthier heart rate, decreased cholesterol and triglyceride levels, improved immune function, and reduced stress. Seek guidance from your physician and exercise physiologist before implementing an exercise routine and learn your capabilities and proper form for all exercise.  Medical   Aging: Enhancing Your Quality of Life  Clinical staff conducted group or individual video education with verbal and written material and guidebook.  Patient learns key strategies and recommendations to stay in good physical health and enhance quality of life, such as prevention strategies, having an advocate, securing a Health Care Proxy and Power of Attorney, and keeping a list of medications and system for tracking them. It also discusses how to avoid risk for bone loss.  Biology of Weight Control  Clinical staff conducted group or individual video education with  verbal and written material and guidebook.  Patient learns that weight gain occurs because we consume more calories than we burn (eating more, moving less). Even if your body weight is normal, you may have higher ratios of fat compared to muscle mass. Too much body fat puts you at increased risk for cardiovascular disease, heart attack, stroke, type 2 diabetes, and obesity-related cancers. In addition to exercise, following the Pritikin Eating Plan can help reduce your risk.  Decoding Lab Results  Clinical staff conducted group or individual video education with verbal and written material and guidebook.  Patient learns that lab test reflects one measurement whose values change over time and are influenced by many factors, including medication, stress, sleep, exercise, food, hydration, pre-existing medical conditions, and more. It is recommended to use the knowledge from this video to become more involved with your lab results and evaluate your numbers to speak with your doctor.   Diseases of Our Time - Overview  Clinical staff conducted group or individual video education with verbal and written material and guidebook.  Patient learns that according to the CDC, 50% to 70% of chronic diseases (such as obesity, type 2 diabetes, elevated lipids, hypertension, and heart disease) are avoidable through lifestyle improvements including healthier food choices, listening to satiety cues, and increased physical activity.  Sleep Disorders Clinical staff conducted group or individual video education with verbal and written material and guidebook.  Patient learns how good quality and duration of sleep are important to overall health and  well-being. Patient also learns about sleep disorders and how they impact health along with recommendations to address them, including discussing with a physician.  Nutrition  Dining Out - Part 2 Clinical staff conducted group or individual video education with verbal and  written material and guidebook.  Patient learns how to plan ahead and communicate in order to maximize their dining experience in a healthy and nutritious manner. Included are recommended food choices based on the type of restaurant the patient is visiting.   Fueling a Banker conducted group or individual video education with verbal and written material and guidebook.  There is a strong connection between our food choices and our health. Diseases like obesity and type 2 diabetes are very prevalent and are in large-part due to lifestyle choices. The Pritikin Eating Plan provides plenty of food and hunger-curbing satisfaction. It is easy to follow, affordable, and helps reduce health risks.  Menu Workshop  Clinical staff conducted group or individual video education with verbal and written material and guidebook.  Patient learns that restaurant meals can sabotage health goals because they are often packed with calories, fat, sodium, and sugar. Recommendations include strategies to plan ahead and to communicate with the manager, chef, or server to help order a healthier meal.  Planning Your Eating Strategy  Clinical staff conducted group or individual video education with verbal and written material and guidebook.  Patient learns about the Pritikin Eating Plan and its benefit of reducing the risk of disease. The Pritikin Eating Plan does not focus on calories. Instead, it emphasizes high-quality, nutrient-rich foods. By knowing the characteristics of the foods, we choose, we can determine their calorie density and make informed decisions.  Targeting Your Nutrition Priorities  Clinical staff conducted group or individual video education with verbal and written material and guidebook.  Patient learns that lifestyle habits have a tremendous impact on disease risk and progression. This video provides eating and physical activity recommendations based on your personal health goals,  such as reducing LDL cholesterol, losing weight, preventing or controlling type 2 diabetes, and reducing high blood pressure.  Vitamins and Minerals  Clinical staff conducted group or individual video education with verbal and written material and guidebook.  Patient learns different ways to obtain key vitamins and minerals, including through a recommended healthy diet. It is important to discuss all supplements you take with your doctor.   Healthy Mind-Set    Smoking Cessation  Clinical staff conducted group or individual video education with verbal and written material and guidebook.  Patient learns that cigarette smoking and tobacco addiction pose a serious health risk which affects millions of people. Stopping smoking will significantly reduce the risk of heart disease, lung disease, and many forms of cancer. Recommended strategies for quitting are covered, including working with your doctor to develop a successful plan.  Culinary   Becoming a Set designer conducted group or individual video education with verbal and written material and guidebook.  Patient learns that cooking at home can be healthy, cost-effective, quick, and puts them in control. Keys to cooking healthy recipes will include looking at your recipe, assessing your equipment needs, planning ahead, making it simple, choosing cost-effective seasonal ingredients, and limiting the use of added fats, salts, and sugars.  Cooking - Breakfast and Snacks  Clinical staff conducted group or individual video education with verbal and written material and guidebook.  Patient learns how important breakfast is to satiety and nutrition through the entire day. Recommendations  include key foods to eat during breakfast to help stabilize blood sugar levels and to prevent overeating at meals later in the day. Planning ahead is also a key component.  Cooking - Educational psychologist conducted group or individual video  education with verbal and written material and guidebook.  Patient learns eating strategies to improve overall health, including an approach to cook more at home. Recommendations include thinking of animal protein as a side on your plate rather than center stage and focusing instead on lower calorie dense options like vegetables, fruits, whole grains, and plant-based proteins, such as beans. Making sauces in large quantities to freeze for later and leaving the skin on your vegetables are also recommended to maximize your experience.  Cooking - Healthy Salads and Dressing Clinical staff conducted group or individual video education with verbal and written material and guidebook.  Patient learns that vegetables, fruits, whole grains, and legumes are the foundations of the Pritikin Eating Plan. Recommendations include how to incorporate each of these in flavorful and healthy salads, and how to create homemade salad dressings. Proper handling of ingredients is also covered. Cooking - Soups and [employer] - Soups and Desserts Clinical staff conducted group or individual video education with verbal and written material and guidebook.  Patient learns that Pritikin soups and desserts make for easy, nutritious, and delicious snacks and meal components that are low in sodium, fat, sugar, and calorie density, while high in vitamins, minerals, and filling fiber. Recommendations include simple and healthy ideas for soups and desserts.   Overview     The Pritikin Solution Program Overview Clinical staff conducted group or individual video education with verbal and written material and guidebook.  Patient learns that the results of the Pritikin Program have been documented in more than 100 articles published in peer-reviewed journals, and the benefits include reducing risk factors for (and, in some cases, even reversing) high cholesterol, high blood pressure, type 2 diabetes, obesity, and more! An overview of  the three key pillars of the Pritikin Program will be covered: eating well, doing regular exercise, and having a healthy mind-set.  WORKSHOPS  Exercise: Exercise Basics: Building Your Action Plan Clinical staff led group instruction and group discussion with PowerPoint presentation and patient guidebook. To enhance the learning environment the use of posters, models and videos may be added. At the conclusion of this workshop, patients will comprehend the difference between physical activity and exercise, as well as the benefits of incorporating both, into their routine. Patients will understand the FITT (Frequency, Intensity, Time, and Type) principle and how to use it to build an exercise action plan. In addition, safety concerns and other considerations for exercise and cardiac rehab will be addressed by the presenter. The purpose of this lesson is to promote a comprehensive and effective weekly exercise routine in order to improve patients' overall level of fitness.   Managing Heart Disease: Your Path to a Healthier Heart Clinical staff led group instruction and group discussion with PowerPoint presentation and patient guidebook. To enhance the learning environment the use of posters, models and videos may be added.At the conclusion of this workshop, patients will understand the anatomy and physiology of the heart. Additionally, they will understand how Pritikin's three pillars impact the risk factors, the progression, and the management of heart disease.  The purpose of this lesson is to provide a high-level overview of the heart, heart disease, and how the Pritikin lifestyle positively impacts risk factors.  Exercise Biomechanics  Clinical staff led group instruction and group discussion with PowerPoint presentation and patient guidebook. To enhance the learning environment the use of posters, models and videos may be added. Patients will learn how the structural parts of their bodies  function and how these functions impact their daily activities, movement, and exercise. Patients will learn how to promote a neutral spine, learn how to manage pain, and identify ways to improve their physical movement in order to promote healthy living. The purpose of this lesson is to expose patients to common physical limitations that impact physical activity. Participants will learn practical ways to adapt and manage aches and pains, and to minimize their effect on regular exercise. Patients will learn how to maintain good posture while sitting, walking, and lifting.  Balance Training and Fall Prevention  Clinical staff led group instruction and group discussion with PowerPoint presentation and patient guidebook. To enhance the learning environment the use of posters, models and videos may be added. At the conclusion of this workshop, patients will understand the importance of their sensorimotor skills (vision, proprioception, and the vestibular system) in maintaining their ability to balance as they age. Patients will apply a variety of balancing exercises that are appropriate for their current level of function. Patients will understand the common causes for poor balance, possible solutions to these problems, and ways to modify their physical environment in order to minimize their fall risk. The purpose of this lesson is to teach patients about the importance of maintaining balance as they age and ways to minimize their risk of falling.  WORKSHOPS   Nutrition:  Fueling a Ship broker led group instruction and group discussion with PowerPoint presentation and patient guidebook. To enhance the learning environment the use of posters, models and videos may be added. Patients will review the foundational principles of the Pritikin Eating Plan and understand what constitutes a serving size in each of the food groups. Patients will also learn Pritikin-friendly foods that are better  choices when away from home and review make-ahead meal and snack options. Calorie density will be reviewed and applied to three nutrition priorities: weight maintenance, weight loss, and weight gain. The purpose of this lesson is to reinforce (in a group setting) the key concepts around what patients are recommended to eat and how to apply these guidelines when away from home by planning and selecting Pritikin-friendly options. Patients will understand how calorie density may be adjusted for different weight management goals.  Mindful Eating  Clinical staff led group instruction and group discussion with PowerPoint presentation and patient guidebook. To enhance the learning environment the use of posters, models and videos may be added. Patients will briefly review the concepts of the Pritikin Eating Plan and the importance of low-calorie dense foods. The concept of mindful eating will be introduced as well as the importance of paying attention to internal hunger signals. Triggers for non-hunger eating and techniques for dealing with triggers will be explored. The purpose of this lesson is to provide patients with the opportunity to review the basic principles of the Pritikin Eating Plan, discuss the value of eating mindfully and how to measure internal cues of hunger and fullness using the Hunger Scale. Patients will also discuss reasons for non-hunger eating and learn strategies to use for controlling emotional eating.  Targeting Your Nutrition Priorities Clinical staff led group instruction and group discussion with PowerPoint presentation and patient guidebook. To enhance the learning environment the use of posters, models and videos may be added. Patients  will learn how to determine their genetic susceptibility to disease by reviewing their family history. Patients will gain insight into the importance of diet as part of an overall healthy lifestyle in mitigating the impact of genetics and other  environmental insults. The purpose of this lesson is to provide patients with the opportunity to assess their personal nutrition priorities by looking at their family history, their own health history and current risk factors. Patients will also be able to discuss ways of prioritizing and modifying the Pritikin Eating Plan for their highest risk areas  Menu  Clinical staff led group instruction and group discussion with PowerPoint presentation and patient guidebook. To enhance the learning environment the use of posters, models and videos may be added. Using menus brought in from [employer], or printed from [employer], patients will apply the Pritikin dining out guidelines that were presented in the [employer] video. Patients will also be able to practice these guidelines in a variety of provided scenarios. The purpose of this lesson is to provide patients with the opportunity to practice hands-on learning of the Pritikin Dining Out guidelines with actual menus and practice scenarios.  Label Reading Clinical staff led group instruction and group discussion with PowerPoint presentation and patient guidebook. To enhance the learning environment the use of posters, models and videos may be added. Patients will review and discuss the Pritikin label reading guidelines presented in Pritikin's Label Reading Educational series video. Using fool labels brought in from local grocery stores and markets, patients will apply the label reading guidelines and determine if the packaged food meet the Pritikin guidelines. The purpose of this lesson is to provide patients with the opportunity to review, discuss, and practice hands-on learning of the Pritikin Label Reading guidelines with actual packaged food labels. Cooking School  Pritikin's [employer] are designed to teach patients ways to prepare quick, simple, and affordable recipes at home. The importance of nutrition's role in  chronic disease risk reduction is reflected in its emphasis in the overall Pritikin program. By learning how to prepare essential core Pritikin Eating Plan recipes, patients will increase control over what they eat; be able to customize the flavor of foods without the use of added salt, sugar, or fat; and improve the quality of the food they consume. By learning a set of core recipes which are easily assembled, quickly prepared, and affordable, patients are more likely to prepare more healthy foods at home. These workshops focus on convenient breakfasts, simple entres, side dishes, and desserts which can be prepared with minimal effort and are consistent with nutrition recommendations for cardiovascular risk reduction. Cooking [employer] are taught by a Armed forces logistics/support/administrative officer (RD) who has been trained by the [employer]. The chef or RD has a clear understanding of the importance of minimizing - if not completely eliminating - added fat, sugar, and sodium in recipes. Throughout the series of Cooking School Workshop sessions, patients will learn about healthy ingredients and efficient methods of cooking to build confidence in their capability to prepare    Cooking School weekly topics:  Adding Flavor- Sodium-Free  Fast and Healthy Breakfasts  Powerhouse Plant-Based Proteins  Satisfying Salads and Dressings  Simple Sides and Sauces  International Cuisine-Spotlight on the [employer] Zones  Delicious Desserts  Savory Soups  [employer] - Meals in a Astronomer Appetizers and Snacks  Comforting Weekend Breakfasts  One-Pot Wonders   Fast Evening Meals  Landscape architect Your  Pritikin Plate  WORKSHOPS   Healthy Mindset (Psychosocial):  Focused Goals, Sustainable Changes Clinical staff led group instruction and group discussion with PowerPoint presentation and patient guidebook. To enhance the learning environment the use of posters, models and videos may  be added. Patients will be able to apply effective goal setting strategies to establish at least one personal goal, and then take consistent, meaningful action toward that goal. They will learn to identify common barriers to achieving personal goals and develop strategies to overcome them. Patients will also gain an understanding of how our mind-set can impact our ability to achieve goals and the importance of cultivating a positive and growth-oriented mind-set. The purpose of this lesson is to provide patients with a deeper understanding of how to set and achieve personal goals, as well as the tools and strategies needed to overcome common obstacles which may arise along the way.  From Head to Heart: The Power of a Healthy Outlook  Clinical staff led group instruction and group discussion with PowerPoint presentation and patient guidebook. To enhance the learning environment the use of posters, models and videos may be added. Patients will be able to recognize and describe the impact of emotions and mood on physical health. They will discover the importance of self-care and explore self-care practices which may work for them. Patients will also learn how to utilize the 4 C's to cultivate a healthier outlook and better manage stress and challenges. The purpose of this lesson is to demonstrate to patients how a healthy outlook is an essential part of maintaining good health, especially as they continue their cardiac rehab journey.  Healthy Sleep for a Healthy Heart Clinical staff led group instruction and group discussion with PowerPoint presentation and patient guidebook. To enhance the learning environment the use of posters, models and videos may be added. At the conclusion of this workshop, patients will be able to demonstrate knowledge of the importance of sleep to overall health, well-being, and quality of life. They will understand the symptoms of, and treatments for, common sleep disorders. Patients  will also be able to identify daytime and nighttime behaviors which impact sleep, and they will be able to apply these tools to help manage sleep-related challenges. The purpose of this lesson is to provide patients with a general overview of sleep and outline the importance of quality sleep. Patients will learn about a few of the most common sleep disorders. Patients will also be introduced to the concept of "sleep hygiene," and discover ways to self-manage certain sleeping problems through simple daily behavior changes. Finally, the workshop will motivate patients by clarifying the links between quality sleep and their goals of heart-healthy living.   Recognizing and Reducing Stress Clinical staff led group instruction and group discussion with PowerPoint presentation and patient guidebook. To enhance the learning environment the use of posters, models and videos may be added. At the conclusion of this workshop, patients will be able to understand the types of stress reactions, differentiate between acute and chronic stress, and recognize the impact that chronic stress has on their health. They will also be able to apply different coping mechanisms, such as reframing negative self-talk. Patients will have the opportunity to practice a variety of stress management techniques, such as deep abdominal breathing, progressive muscle relaxation, and/or guided imagery.  The purpose of this lesson is to educate patients on the role of stress in their lives and to provide healthy techniques for coping with it.  Learning Barriers/Preferences:  Learning Barriers/Preferences -  03/14/23 1410       Learning Barriers/Preferences   Learning Barriers Exercise Concerns   Poor balance   Learning Preferences Skilled Demonstration;Individual Instruction;Group Instruction;Pictoral;Written Material             Education Topics:  Knowledge Questionnaire Score:  Knowledge Questionnaire Score - 03/14/23 1411        Knowledge Questionnaire Score   Pre Score 20/24             Core Components/Risk Factors/Patient Goals at Admission:  Personal Goals and Risk Factors at Admission - 03/14/23 1413       Core Components/Risk Factors/Patient Goals on Admission   Hypertension Yes    Intervention Provide education on lifestyle modifcations including regular physical activity/exercise, weight management, moderate sodium restriction and increased consumption of fresh fruit, vegetables, and low fat dairy, alcohol moderation, and smoking cessation.;Monitor prescription use compliance.    Expected Outcomes Short Term: Continued assessment and intervention until BP is < 140/32mm HG in hypertensive participants. < 130/64mm HG in hypertensive participants with diabetes, heart failure or chronic kidney disease.;Long Term: Maintenance of blood pressure at goal levels.    Lipids Yes    Intervention Provide education and support for participant on nutrition & aerobic/resistive exercise along with prescribed medications to achieve LDL 70mg , HDL >40mg .    Expected Outcomes Short Term: Participant states understanding of desired cholesterol values and is compliant with medications prescribed. Participant is following exercise prescription and nutrition guidelines.;Long Term: Cholesterol controlled with medications as prescribed, with individualized exercise RX and with personalized nutrition plan. Value goals: LDL < 70mg , HDL > 40 mg.    Stress Yes    Intervention Offer individual and/or small group education and counseling on adjustment to heart disease, stress management and health-related lifestyle change. Teach and support self-help strategies.;Refer participants experiencing significant psychosocial distress to appropriate mental health specialists for further evaluation and treatment. When possible, include family members and significant others in education/counseling sessions.    Expected Outcomes Short Term: Participant  demonstrates changes in health-related behavior, relaxation and other stress management skills, ability to obtain effective social support, and compliance with psychotropic medications if prescribed.;Long Term: Emotional wellbeing is indicated by absence of clinically significant psychosocial distress or social isolation.    Personal Goal Other Yes    Personal Goal Short: control SOB Long: get back to work. Stamina    Intervention Will continue to monitor pt and progress workloads as tolerated without sign or symptom    Expected Outcomes Pt will achieve her goals             Core Components/Risk Factors/Patient Goals Review:    Core Components/Risk Factors/Patient Goals at Discharge (Final Review):    ITP Comments:  ITP Comments     Row Name 03/14/23 1122 03/19/23 1057         ITP Comments Dr. Armanda Magic medical director. Introduction to pritikin education/ intensive cardiac rehab. initial orientation packet reviewed with patient. 30 Day ITP Review. Louva is scheduled to start cardiac rehab on 03/22/23.               Comments: See ITP comments.Thayer Headings RN BSN

## 2023-03-21 ENCOUNTER — Ambulatory Visit (HOSPITAL_COMMUNITY): Payer: Medicare Other

## 2023-03-22 ENCOUNTER — Ambulatory Visit (HOSPITAL_COMMUNITY): Payer: Medicare Other

## 2023-03-25 ENCOUNTER — Ambulatory Visit (HOSPITAL_COMMUNITY): Payer: Medicare Other

## 2023-03-25 ENCOUNTER — Telehealth (HOSPITAL_COMMUNITY): Payer: Self-pay | Admitting: *Deleted

## 2023-03-25 NOTE — Telephone Encounter (Signed)
I called and spoke with Victoria Fitzgerald at [facility] rehab and notified of below response from Dr. Wynona Neat. She verbalized understanding. Nothing further needed.

## 2023-03-25 NOTE — Telephone Encounter (Signed)
Spoke with Victoria Fitzgerald, she was started on an antidepressant by her primary care provider. Adrinna says she needs another week before starting exercise. Mianicole denies having a fever. Chalea says she has been dealing with anxiety. Its been hard for her since the loss of her husband. Ayla says she is going to try to get her son to bring her to class as she is worried about managing her portable oxygen tank.Thayer Headings RN BSN

## 2023-03-26 ENCOUNTER — Ambulatory Visit (HOSPITAL_COMMUNITY): Payer: Medicare Other

## 2023-03-28 ENCOUNTER — Ambulatory Visit (HOSPITAL_COMMUNITY): Payer: Medicare Other

## 2023-03-29 ENCOUNTER — Ambulatory Visit (HOSPITAL_COMMUNITY): Payer: Medicare Other

## 2023-04-01 ENCOUNTER — Telehealth (HOSPITAL_COMMUNITY): Payer: Self-pay

## 2023-04-01 ENCOUNTER — Telehealth (HOSPITAL_COMMUNITY): Payer: Self-pay | Admitting: *Deleted

## 2023-04-01 ENCOUNTER — Ambulatory Visit (HOSPITAL_COMMUNITY): Payer: Medicare Other

## 2023-04-01 NOTE — Telephone Encounter (Signed)
-----   Message from Martina Sinner sent at 03/30/2023  9:20 AM EDT ----- Regarding: RE: Oxygen therapy for Cardiac rehab Ok to titrate oxygen to maintain oxygen saturation 88% or greater.  Thanks you ----- Message ----- From: Chelsea Aus, RN Sent: 03/14/2023  10:53 AM EDT To: Martina Sinner, MD Subject: Oxygen therapy for Cardiac rehab                Dr. 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 For cardiac rehab unlike pulmonary rehab we do need an order to use and titrate oxygen therapy prn.  Ok to use oxygen during cardiac rehab?  If so, what is an acceptable oxygen saturation? Lastly, ok to titrate the oxygen level to maintain this saturation?  Thank you for your input  Karlene Lineman RN, BSN Cardiac and Pulmonary Rehab Nurse Navigator

## 2023-04-01 NOTE — Telephone Encounter (Signed)
Pt son Donnie called and stated pt has a fever and will be out for CR today. Adv Donnie pt has to be 48 hours symptom free,  he verbalized understanding.

## 2023-04-02 ENCOUNTER — Ambulatory Visit (HOSPITAL_COMMUNITY): Payer: Medicare Other

## 2023-04-03 ENCOUNTER — Telehealth (HOSPITAL_COMMUNITY): Payer: Self-pay | Admitting: *Deleted

## 2023-04-03 NOTE — Telephone Encounter (Signed)
Left message to call cardiac rehab.Shaynah Hund Walden Kinzi Frediani RN BSN  

## 2023-04-04 ENCOUNTER — Ambulatory Visit (HOSPITAL_COMMUNITY): Payer: Medicare Other

## 2023-04-04 ENCOUNTER — Telehealth (HOSPITAL_COMMUNITY): Payer: Self-pay

## 2023-04-04 NOTE — Telephone Encounter (Signed)
Pt called and stated she will not be able to CR Friday due to a viral infection. She also stated she would like to hold off with CR at this time due to her other health issues and will give [place] a call at a later date, if she would like to reschedule.

## 2023-04-05 ENCOUNTER — Ambulatory Visit (HOSPITAL_COMMUNITY): Payer: Medicare Other

## 2023-04-08 ENCOUNTER — Ambulatory Visit (HOSPITAL_COMMUNITY): Payer: Medicare Other

## 2023-04-09 ENCOUNTER — Encounter (HOSPITAL_COMMUNITY): Payer: Self-pay | Admitting: *Deleted

## 2023-04-09 ENCOUNTER — Ambulatory Visit (HOSPITAL_COMMUNITY): Payer: Medicare Other

## 2023-04-09 DIAGNOSIS — I214 Non-ST elevation (NSTEMI) myocardial infarction: Secondary | ICD-10-CM

## 2023-04-09 DIAGNOSIS — Z955 Presence of coronary angioplasty implant and graft: Secondary | ICD-10-CM

## 2023-04-09 NOTE — Progress Notes (Signed)
Victoria Fitzgerald has bee discharged from cardiac rehab as requested. Victoria Fitzgerald attended orientation on 03/04/23. Victoria Fitzgerald did not return to start exercise..mw

## 2023-04-11 ENCOUNTER — Ambulatory Visit (HOSPITAL_COMMUNITY): Payer: Medicare Other

## 2023-04-12 ENCOUNTER — Ambulatory Visit (HOSPITAL_COMMUNITY): Payer: Medicare Other

## 2023-04-15 ENCOUNTER — Ambulatory Visit: Payer: Medicare Other | Admitting: Pulmonary Disease

## 2023-04-16 ENCOUNTER — Ambulatory Visit (HOSPITAL_COMMUNITY): Payer: Medicare Other

## 2023-04-18 ENCOUNTER — Ambulatory Visit (HOSPITAL_COMMUNITY): Payer: Medicare Other

## 2023-04-19 ENCOUNTER — Ambulatory Visit (HOSPITAL_COMMUNITY): Payer: Medicare Other

## 2023-04-19 ENCOUNTER — Telehealth: Payer: Self-pay | Admitting: Primary Care

## 2023-04-19 NOTE — Telephone Encounter (Signed)
FMLA paperwork that was received in September 2024 - I called [employer] and the representative told me that this FMLA paperwork is no longer needed.

## 2023-04-22 ENCOUNTER — Ambulatory Visit (HOSPITAL_COMMUNITY): Payer: Medicare Other

## 2023-04-23 ENCOUNTER — Ambulatory Visit (HOSPITAL_COMMUNITY): Payer: Medicare Other

## 2023-04-26 ENCOUNTER — Ambulatory Visit (HOSPITAL_COMMUNITY): Payer: Medicare Other

## 2023-04-29 ENCOUNTER — Ambulatory Visit (HOSPITAL_COMMUNITY): Payer: Medicare Other

## 2023-04-30 ENCOUNTER — Ambulatory Visit (HOSPITAL_COMMUNITY): Payer: Medicare Other

## 2023-05-02 ENCOUNTER — Ambulatory Visit (HOSPITAL_COMMUNITY): Payer: Medicare Other

## 2023-05-03 ENCOUNTER — Ambulatory Visit (HOSPITAL_COMMUNITY): Payer: Medicare Other

## 2023-05-06 ENCOUNTER — Ambulatory Visit (HOSPITAL_COMMUNITY): Payer: Medicare Other

## 2023-05-07 ENCOUNTER — Ambulatory Visit (HOSPITAL_COMMUNITY): Payer: Medicare Other

## 2023-05-09 ENCOUNTER — Ambulatory Visit (HOSPITAL_COMMUNITY): Payer: Medicare Other

## 2023-05-10 ENCOUNTER — Ambulatory Visit (HOSPITAL_COMMUNITY): Payer: Medicare Other

## 2023-05-10 ENCOUNTER — Telehealth: Payer: Self-pay | Admitting: Pulmonary Disease

## 2023-05-10 NOTE — Telephone Encounter (Signed)
Patient's son is dropping off paper work. DMV forms need to be signed. When it is completed please call Donnie [phone]. Paper will be placed in Dr.Dewald's box.

## 2023-05-10 NOTE — Telephone Encounter (Signed)
Will be placed in [place] Dura NP box since Dr.Dewald will be out and the form needs to be signed.

## 2023-05-13 ENCOUNTER — Ambulatory Visit (HOSPITAL_COMMUNITY): Payer: Medicare Other

## 2023-05-13 NOTE — Telephone Encounter (Signed)
We just signed it, put in fax outbox

## 2023-05-13 NOTE — Telephone Encounter (Signed)
I confirmed that Victoria Fitzgerald has the paperwork  Routing to her for update, thanks

## 2023-05-14 ENCOUNTER — Ambulatory Visit (HOSPITAL_COMMUNITY): Payer: Medicare Other

## 2023-05-14 NOTE — Telephone Encounter (Signed)
Called Donnie and there was no answer- LMOM letting him know form has been taken care of.

## 2023-05-16 ENCOUNTER — Ambulatory Visit (HOSPITAL_COMMUNITY): Payer: Medicare Other

## 2023-05-17 ENCOUNTER — Ambulatory Visit (HOSPITAL_COMMUNITY): Payer: Medicare Other

## 2023-05-20 ENCOUNTER — Ambulatory Visit (HOSPITAL_COMMUNITY): Payer: Medicare Other

## 2023-05-24 ENCOUNTER — Ambulatory Visit (HOSPITAL_COMMUNITY): Payer: Medicare Other

## 2023-05-27 ENCOUNTER — Ambulatory Visit (HOSPITAL_COMMUNITY): Payer: Medicare Other

## 2023-05-31 ENCOUNTER — Ambulatory Visit (HOSPITAL_COMMUNITY): Payer: Medicare Other

## 2023-06-03 ENCOUNTER — Ambulatory Visit (HOSPITAL_COMMUNITY): Payer: Medicare Other

## 2023-06-07 ENCOUNTER — Ambulatory Visit (HOSPITAL_COMMUNITY): Payer: Medicare Other

## 2023-06-19 ENCOUNTER — Ambulatory Visit: Payer: Medicare Other | Admitting: Pulmonary Disease

## 2023-07-02 ENCOUNTER — Ambulatory Visit: Payer: Medicare Other | Admitting: Pulmonary Disease

## 2023-09-03 ENCOUNTER — Ambulatory Visit: Payer: Medicare Other | Admitting: Pulmonary Disease

## 2023-11-21 ENCOUNTER — Ambulatory Visit: Admitting: Pulmonary Disease

## 2023-11-21 ENCOUNTER — Encounter

## 2024-03-24 ENCOUNTER — Other Ambulatory Visit: Payer: Self-pay | Admitting: Pulmonary Disease

## 2024-03-25 ENCOUNTER — Ambulatory Visit (INDEPENDENT_AMBULATORY_CARE_PROVIDER_SITE_OTHER)

## 2024-03-25 ENCOUNTER — Encounter (HOSPITAL_BASED_OUTPATIENT_CLINIC_OR_DEPARTMENT_OTHER): Payer: Self-pay

## 2024-03-25 VITALS — BP 136/77 | HR 80 | Ht 62.0 in | Wt 108.0 lb

## 2024-03-25 DIAGNOSIS — J9611 Chronic respiratory failure with hypoxia: Secondary | ICD-10-CM

## 2024-03-25 DIAGNOSIS — J449 Chronic obstructive pulmonary disease, unspecified: Secondary | ICD-10-CM | POA: Diagnosis not present

## 2024-03-25 MED ORDER — SPIRIVA RESPIMAT 2.5 MCG/ACT IN AERS: 2.0000 | INHALATION_SPRAY | Freq: Every day | RESPIRATORY_TRACT | 11 refills | Status: AC

## 2024-03-25 MED ORDER — ALBUTEROL SULFATE HFA 108 (90 BASE) MCG/ACT IN AERS: 2.0000 | INHALATION_SPRAY | Freq: Four times a day (QID) | RESPIRATORY_TRACT | 6 refills | Status: AC | PRN

## 2024-03-25 NOTE — Progress Notes (Signed)
 @Patient  ID: Victoria Fitzgerald, female    DOB: [birth year], 83 y.o.   MRN: [id number]  Chief Complaint  Patient presents with   COPD    Follow up     Referring provider: No ref. provider found  HPI: Discussed the use of AI scribe software for clinical note transcription with the patient, who gave verbal consent to proceed.  History of Present Illness Victoria Fitzgerald is an 83 year old female with COPD who presents for a prescription refill of Spiriva .  She uses Spiriva  daily, taking two puffs once a day, and has a Ventolin (albuterol) inhaler, which she has used once or twice for anxiety-related symptoms in the last year.  She experiences shortness of breath, which she attributes to her COPD. She has a history of smoking but quit several years ago. No current issues with chest pain, fever, or chills.  Her last visit was in August 2024, and she has an upcoming appointment with a cardiologist next Friday. Her blood pressure tends to rise due to 'white coat' syndrome when visiting doctors.  She prefers to have her [facility] filled at [facility] [place]. There was a previous issue with her prescriptions being under different names, but it has been resolved by listing her name as Victoria Fitzgerald.  Last OV 01/07/2023:   83 year old female, former smoker quit 2021. PMH COPD, hypertension, anemia, coronary artery disease.  Patient of Dr. Kara, seen for initial consult on 07/18/2022.  Patient presents today for 32-month follow-up/COPD and chronic respiratory failure. Former smoker, quit smoking in 2021. During last office visit she was ordered for pulmonary function testing and referred to pulmonary rehab. Spiriva  has been working well for her. Requiring oxygen 24/7 since she had heart attack in May. Maintained on 2 L of oxygen. She feels limited by her oxygen requirements. Goal is to get back to work, she previously worked part time at a lobby[employer]  TEST/EVENTS : none new  Not on  [employer] History  Administered Date(s) Administered   Fluad Quad(high Dose 65+) 02/24/2020, 03/22/2022   PFIZER(Purple Top)SARS-COV-2 Vaccination 07/04/2019, 07/25/2019    Past Medical History:  Diagnosis Date   Hypertension    Pneumonia due to COVID-19 virus 2022    Tobacco History: Social History   Tobacco Use  Smoking Status Former   Current packs/day: 0.00   Types: Cigarettes   Quit date: 05/29/2019   Years since quitting: 4.8   Passive exposure: Past  Smokeless Tobacco Never   Counseling given: Not Answered   Outpatient Medications Prior to Visit  Medication Sig Dispense Refill   acetaminophen (TYLENOL) 500 MG tablet Take 500 mg by mouth every 4 (four) hours as needed for mild pain or moderate pain.     amLODipine (NORVASC) 10 MG tablet Take 0.5 tablets by mouth daily.     aspirin 81 MG chewable tablet Chew 81 mg by mouth daily.     BRILINTA 90 MG TABS tablet Take 90 mg by mouth 2 (two) times daily.     Cholecalciferol 125 MCG (5000 UT) capsule Take 1 capsule by mouth daily.     famotidine (PEPCID) 20 MG tablet Take 20 mg by mouth 2 (two) times daily.     isosorbide mononitrate (IMDUR) 60 MG 24 hr tablet Take 60 mg by mouth daily.     metoprolol tartrate (LOPRESSOR) 25 MG tablet Take 25 mg by mouth 2 (two) times daily.     nitroGLYCERIN (NITROSTAT) 0.4 MG SL tablet Place 0.4 mg  under the tongue every 5 (five) minutes as needed.     pravastatin (PRAVACHOL) 40 MG tablet Take 20 mg by mouth daily.     valsartan (DIOVAN) 80 MG tablet Take 120 mg by mouth daily.     Tiotropium Bromide Monohydrate  (SPIRIVA  RESPIMAT) 2.5 MCG/ACT AERS INHALE 2 PUFFS BY MOUTH INTO THE LUNGS DAILY 4 g 11   VENTOLIN HFA 108 (90 Base) MCG/ACT inhaler Inhale 1-2 puffs into the lungs every 6 (six) hours as needed.     No facility-administered medications prior to visit.     Review of Systems:   Constitutional:   No  weight loss, night sweats,  Fevers, chills, fatigue, or   lassitude.  HEENT:   No headaches,  Difficulty swallowing,  Tooth/dental problems, or  Sore throat,                No sneezing, itching, ear ache, nasal congestion, post nasal drip,   CV:  No chest pain,  Orthopnea, PND, swelling in lower extremities, anasarca, dizziness, palpitations, syncope.   GI  No heartburn, indigestion, abdominal pain, nausea, vomiting, diarrhea, change in bowel habits, loss of appetite, bloody stools.   Resp: No shortness of breath with exertion or at rest.  No excess mucus, no productive cough,  No non-productive cough,  No coughing up of blood.  No change in color of mucus.  No wheezing.  No chest wall deformity  Skin: no rash or lesions.  GU: no dysuria, change in color of urine, no urgency or frequency.  No flank pain, no hematuria   MS:  No joint pain or swelling.  No decreased range of motion.  No back pain.    Physical Exam  BP (!) 161/78   Pulse 80   Ht 5' 2 (1.575 m)   Wt 108 lb (49 kg)   SpO2 91% Comment: 2 liters  BMI 19.75 kg/m   GEN: A/Ox3; pleasant , NAD, well nourished    HEENT:  [place]/AT,  EACs-clear, TMs-wnl, NOSE-clear, THROAT-clear, no lesions, no postnasal drip or exudate noted.   NECK:  Supple w/ fair ROM; no JVD; normal carotid impulses w/o bruits; no thyromegaly or nodules palpated; no lymphadenopathy.    RESP  Clear but diminished throughout P & A; w/o, wheezes/ rales/ or rhonchi. no accessory muscle use, no dullness to percussion  CARD:  RRR, no m/r/g, no peripheral edema, pulses intact, no cyanosis or clubbing.  GI:   Soft & nt; nml bowel sounds; no organomegaly or masses detected.   Musco: Warm bil, no deformities or joint swelling noted.   Neuro: alert, no focal deficits noted.    Skin: Warm, no lesions or rashes    Lab Results:  CBC    Component Value Date/Time   WBC 11.2 (H) 06/30/2008 1231   RBC 4.48 06/30/2008 1231   HGB 14.6 06/30/2008 1231   HCT 41.7 06/30/2008 1231   PLT 267 06/30/2008 1231   MCV 93.2  06/30/2008 1231   MCHC 35.1 06/30/2008 1231   RDW 13.8 06/30/2008 1231   LYMPHSABS 2.5 06/30/2008 1231   MONOABS 0.5 06/30/2008 1231   EOSABS 0.0 06/30/2008 1231   BASOSABS 0.1 06/30/2008 1231    BMET    Component Value Date/Time   NA 139 06/30/2008 1231   K 3.8 06/30/2008 1231   CL 104 06/30/2008 1231   CO2 26 06/30/2008 1231   GLUCOSE 109 (H) 06/30/2008 1231   BUN 6 06/30/2008 1231   CREATININE 0.73 06/30/2008 1231  CALCIUM 9.1 06/30/2008 1231   GFRNONAA >60 06/30/2008 1231   GFRAA  06/30/2008 1231    >60        The eGFR has been calculated using the MDRD equation. This calculation has not been validated in all clinical situations. eGFR's persistently <60 mL/min signify possible Chronic Kidney Disease.    BNP No results found for: BNP  ProBNP No results found for: PROBNP  Imaging: No results found.  Administration History     None           No data to display          No results found for: NITRICOXIDE   Assessment & Plan:   Assessment & Plan Chronic obstructive pulmonary disease, unspecified COPD type (HCC)  Chronic respiratory failure with hypoxia (HCC)  Assessment and Plan Assessment & Plan Chronic obstructive pulmonary disease (COPD) COPD stable, no acute exacerbations. Occasional dyspnea likely COPD-related. Stable respiratory status with no recent albuterol use.  - Continue Spiriva  two puffs once daily. - Continue albuterol two puffs every six hours as needed. - Continue current oxygen therapy regimen. - Schedule follow-up in one year, with option to return sooner if respiratory symptoms worsen.    Return in about 1 year (around 03/25/2025).  Candis Dandy, PA-C 03/25/2024

## 2024-03-25 NOTE — Patient Instructions (Signed)
 Continue Spiriva  2 puffs inhaled once daily.   Continue Albuterol 2 puffs inhaled every 6 hours as needed.   Follow up in one year; may return to clinic sooner if new or worsening daily symptoms.
